# Patient Record
Sex: Female | Born: 1960 | Race: White | Hispanic: No | Marital: Single | Smoking: Former smoker
Health system: Southern US, Community
[De-identification: ages and names within clinical notes are randomized; demographics above are authoritative.]

## PROBLEM LIST (undated history)

## (undated) DIAGNOSIS — L409 Psoriasis, unspecified: Secondary | ICD-10-CM

## (undated) DIAGNOSIS — J189 Pneumonia, unspecified organism: Principal | ICD-10-CM

## (undated) DIAGNOSIS — N762 Acute vulvitis: Secondary | ICD-10-CM

## (undated) DIAGNOSIS — A64 Unspecified sexually transmitted disease: Secondary | ICD-10-CM

## (undated) DIAGNOSIS — F1910 Other psychoactive substance abuse, uncomplicated: Secondary | ICD-10-CM

## (undated) HISTORY — DX: Pneumonia, unspecified organism: J18.9

## (undated) HISTORY — DX: Acute vulvitis: N76.2

## (undated) HISTORY — PX: COLPOSCOPY: SHX161

## (undated) HISTORY — DX: Other psychoactive substance abuse, uncomplicated: F19.10

## (undated) HISTORY — DX: Unspecified sexually transmitted disease: A64

## (undated) HISTORY — DX: Psoriasis, unspecified: L40.9

## (undated) HISTORY — PX: CRYOABLATION: SHX1415

---

## 1998-09-01 ENCOUNTER — Other Ambulatory Visit: Admission: RE | Admit: 1998-09-01 | Discharge: 1998-09-01 | Payer: Self-pay | Admitting: Obstetrics and Gynecology

## 1999-05-06 ENCOUNTER — Other Ambulatory Visit: Admission: RE | Admit: 1999-05-06 | Discharge: 1999-05-06 | Payer: Self-pay | Admitting: Obstetrics and Gynecology

## 2001-01-25 ENCOUNTER — Other Ambulatory Visit: Admission: RE | Admit: 2001-01-25 | Discharge: 2001-01-25 | Payer: Self-pay | Admitting: Obstetrics and Gynecology

## 2002-04-30 ENCOUNTER — Other Ambulatory Visit: Admission: RE | Admit: 2002-04-30 | Discharge: 2002-04-30 | Payer: Self-pay | Admitting: Obstetrics and Gynecology

## 2003-05-07 ENCOUNTER — Other Ambulatory Visit: Admission: RE | Admit: 2003-05-07 | Discharge: 2003-05-07 | Payer: Self-pay | Admitting: Obstetrics and Gynecology

## 2003-09-24 HISTORY — PX: OTHER SURGICAL HISTORY: SHX169

## 2004-07-20 ENCOUNTER — Other Ambulatory Visit: Admission: RE | Admit: 2004-07-20 | Discharge: 2004-07-20 | Payer: Self-pay | Admitting: Obstetrics and Gynecology

## 2005-07-20 ENCOUNTER — Other Ambulatory Visit: Admission: RE | Admit: 2005-07-20 | Discharge: 2005-07-20 | Payer: Self-pay | Admitting: Obstetrics & Gynecology

## 2006-09-01 ENCOUNTER — Other Ambulatory Visit: Admission: RE | Admit: 2006-09-01 | Discharge: 2006-09-01 | Payer: Self-pay | Admitting: Obstetrics and Gynecology

## 2007-09-03 ENCOUNTER — Other Ambulatory Visit: Admission: RE | Admit: 2007-09-03 | Discharge: 2007-09-03 | Payer: Self-pay | Admitting: Obstetrics and Gynecology

## 2008-06-11 ENCOUNTER — Encounter: Admission: RE | Admit: 2008-06-11 | Discharge: 2008-06-11 | Payer: Self-pay | Admitting: Orthopedic Surgery

## 2008-09-03 ENCOUNTER — Other Ambulatory Visit: Admission: RE | Admit: 2008-09-03 | Discharge: 2008-09-03 | Payer: Self-pay | Admitting: Obstetrics & Gynecology

## 2012-05-26 DIAGNOSIS — J189 Pneumonia, unspecified organism: Secondary | ICD-10-CM

## 2012-05-26 HISTORY — DX: Pneumonia, unspecified organism: J18.9

## 2012-06-11 ENCOUNTER — Encounter: Payer: Self-pay | Admitting: Medical

## 2012-06-11 ENCOUNTER — Ambulatory Visit (INDEPENDENT_AMBULATORY_CARE_PROVIDER_SITE_OTHER): Payer: Self-pay | Admitting: Medical

## 2012-06-11 ENCOUNTER — Ambulatory Visit
Admission: RE | Admit: 2012-06-11 | Discharge: 2012-06-11 | Disposition: A | Discharge status: Home / Self Care | Payer: No Typology Code available for payment source | Source: Ambulatory Visit | Attending: Medical | Admitting: Medical

## 2012-06-11 VITALS — BP 128/80 | HR 106 | Temp 98.8°F | Resp 18 | Wt 139.0 lb

## 2012-06-11 DIAGNOSIS — R21 Rash and other nonspecific skin eruption: Secondary | ICD-10-CM

## 2012-06-11 DIAGNOSIS — R05 Cough: Secondary | ICD-10-CM

## 2012-06-11 DIAGNOSIS — R062 Wheezing: Secondary | ICD-10-CM

## 2012-06-11 DIAGNOSIS — R059 Cough, unspecified: Secondary | ICD-10-CM

## 2012-06-11 DIAGNOSIS — R509 Fever, unspecified: Secondary | ICD-10-CM

## 2012-06-11 DIAGNOSIS — H9210 Otorrhea, unspecified ear: Secondary | ICD-10-CM

## 2012-06-11 MED ORDER — ALBUTEROL SULFATE HFA 108 (90 BASE) MCG/ACT IN AERS: 2.0000 | INHALATION_SPRAY | Freq: Four times a day (QID) | RESPIRATORY_TRACT | Status: DC | PRN

## 2012-06-11 MED ORDER — LEVOFLOXACIN 750 MG PO TABS: 750.0000 mg | ORAL_TABLET | Freq: Every day | ORAL | Status: DC

## 2012-06-11 NOTE — Progress Notes (Signed)
Subjective: Here as new patient today.  Normally sees [facility], but having trouble getting into their office.    She denies hx/o asthma, COPD, lung disease, but she reports 2 wk ago started feeling bad with cough, fever up to 102, chest congestion, but cough productive the last 5-6 days, getting worse.  Her boyfriend has been sick with similar.  She has been using OTC cold and flu medication.  Now she has hoarseness, some SOB.  She notes hx/o pneumonia 15 years ago.   Nonsmoker.  No other aggravating or relieving factors.   She notes hx/o chronic ear infections.  Uses neosporin in ear canal daily for unknown long period of time without much benefit.  History reviewed. No pertinent past medical history. Social: self employed frame shop  Review of Systems Constitutional: +fever, -chills, -sweats, -unexpected weight change,+fatigue ENT: -runny nose, +sore throat Cardiology:  -chest pain, -palpitations, -edema Respiratory: +cough, +shortness of breath, +wheezing Gastroenterology: -abdominal pain, -nausea, -vomiting, -diarrhea, -constipation   Objective: Filed Vitals:   06/11/12 1522  BP: 128/80  Pulse: 106  Temp: 98.8 F (37.1 C)  Resp: 18    General appearance: alert, no distress, WD/WN, hoarse voice HEENT: normocephalic, sclerae anicteric, ear canals bilat with erythema, swelling, small erythematous papules along right external ear meatus, left canal with erythema, no obvious drainage, but inflamed appearing canals bilat, TMs with serous fluid behind TMs, nares patent, no discharge or erythema, pharynx normal Oral cavity: MMM, no lesions Neck: supple, no lymphadenopathy, no thyromegaly, no masses Heart: RRR, normal S1, S2, no murmurs Lungs: diffuse wheezes, decreased lower field sounds, no rhonchi or rales MSK: no calve tenderness, no palpable cord, -Homans Ext: no edema, no cyanosis or clubbing Pulses: 2+ symmetric, upper and lower extremities, normal cap  refill  Assessment: Encounter Diagnoses  Name Primary?  . Cough Yes  . Fever, unspecified   . Wheezing   . Rash and nonspecific skin eruption   . Ear discharge     Plan: Cough, fever, wheezing - likely bronchitis vs pneumonia.  Will send for CXR.    Rash, ear discharge - differential includes chronic otitis externa vs neomycin allergy.  Advised she stop neosporin.  Recheck 1wk.  Follow-up pending xray

## 2012-06-12 ENCOUNTER — Other Ambulatory Visit: Payer: Self-pay | Admitting: Medical

## 2012-06-12 ENCOUNTER — Telehealth: Payer: Self-pay | Admitting: Medical

## 2012-06-12 MED ORDER — DOXYCYCLINE HYCLATE 100 MG PO TABS: 100.0000 mg | ORAL_TABLET | Freq: Two times a day (BID) | ORAL | Status: DC

## 2012-06-12 NOTE — Telephone Encounter (Signed)
pls call and let her know that different antibioitc sent.  Have her call and make sure this one is reasonable price (doxycycline). If not, let me know ASAP.    She has pneumonia and we need to get her on antibiotic right away.

## 2012-06-12 NOTE — Telephone Encounter (Signed)
CALLED PT TO ADVISE THAT ANOTHER ANTIBIOTIC SENT TO PHARMACY. SHE WILL CALL us IF THAT ONE IS TOO EXPENSIVE. PT STATES SHE TOOK ONE LEVAQUIN AND HAS BEEN USING INHALER THAT WAS GIVEN YESTERDAY. HER STOMACH IS UPSET. WANTS TO KNOW IF ONE OF THESE MEDS WOULD UPSET HER STOMACH?

## 2012-06-12 NOTE — Telephone Encounter (Signed)
Pt does still have an upset stomach and the doxycycline is only $15.00 so much more afforable she said

## 2012-06-18 ENCOUNTER — Encounter: Payer: Self-pay | Admitting: Medical

## 2012-06-18 ENCOUNTER — Ambulatory Visit (INDEPENDENT_AMBULATORY_CARE_PROVIDER_SITE_OTHER): Payer: Self-pay | Admitting: Medical

## 2012-06-18 VITALS — BP 122/70 | HR 87 | Temp 98.3°F | Resp 16

## 2012-06-18 DIAGNOSIS — J189 Pneumonia, unspecified organism: Secondary | ICD-10-CM

## 2012-06-18 NOTE — Progress Notes (Signed)
Subjective: Here for f/u on pneumonia. I saw her as a new patient last week and she was +for left lower lobe pneumonia on xray.   She is taking the Levaquin.  Feels much improved.  She denies hx/o asthma, COPD, lung disease, but started 3 wk ago with bad with cough, fever up to 102, chest congestion.  Nonsmoker.  No other aggravating or relieving factors.   Past Medical History  Diagnosis Date  . Pneumonia 2/14   Social: self employed frame shop  Review of Systems Constitutional: +fever, -chills, -sweats, -unexpected weight change, -fatigue ENT: -runny nose, -sore throat Cardiology:  -chest pain, -palpitations, -edema Respiratory: +cough, -shortness of breath, -wheezing Gastroenterology: -abdominal pain, -nausea, -vomiting, -diarrhea, -constipation   Objective: Filed Vitals:   06/18/12 1453  BP: 122/70  Pulse: 87  Temp: 98.3 F (36.8 C)  Resp: 16    General appearance: alert, no distress, WD/WN Neck: supple, no lymphadenopathy, no thyromegaly, no masses Lungs: CTA, no wheezes, no rhonchi or rales Ext: no edema, no cyanosis or clubbing   Assessment: Encounter Diagnosis  Name Primary?  . Pneumonia Yes    Plan: Improving, finish Levaquin, advised that cough may linger.  Call or return if worsening.  otherwise symptoms should gradually resolve.

## 2012-10-08 ENCOUNTER — Ambulatory Visit: Payer: Self-pay | Admitting: Nurse Practitioner

## 2012-11-01 ENCOUNTER — Encounter: Payer: Self-pay | Admitting: *Deleted

## 2012-11-06 ENCOUNTER — Ambulatory Visit (INDEPENDENT_AMBULATORY_CARE_PROVIDER_SITE_OTHER): Payer: Self-pay | Admitting: Nurse Practitioner

## 2012-11-06 ENCOUNTER — Encounter: Payer: Self-pay | Admitting: Nurse Practitioner

## 2012-11-06 VITALS — BP 104/58 | HR 64 | Resp 14 | Ht 62.5 in | Wt 143.4 lb

## 2012-11-06 DIAGNOSIS — Z01419 Encounter for gynecological examination (general) (routine) without abnormal findings: Secondary | ICD-10-CM

## 2012-11-06 DIAGNOSIS — Z0000 Encounter for general adult medical examination without abnormal findings: Secondary | ICD-10-CM

## 2012-11-06 LAB — POCT URINALYSIS DIPSTICK: pH, UA: 5.5

## 2012-11-06 NOTE — Patient Instructions (Signed)

## 2012-11-06 NOTE — Progress Notes (Signed)
52 y.o. G0P0 Single Caucasian Fe here for annual exam.  Off POP since 03/2012.  No withdrawal bled after completion. And no vaginal bleeding since 2002. Still having vaginal dryness.  No vaso symptoms. Sleep is good.   Patient's last menstrual period was 12/24/2000.          Sexually active: yes  The current method of family planning is pt is post-menopausal.     Exercising: yes  walking Smoker:  no  Health Maintenance: Pap:  10/05/2011  Normal with negative HR HPV MMG:  06/09/2009 Colonoscopy:  never BMD:   never TDaP:  04/2003 Labs: Hgb- 13.4   reports that she quit smoking about 9 years ago. Her smoking use included Cigarettes. She has a 2.5 pack-year smoking history. She has never used smokeless tobacco. She reports that she drinks about 4.2 ounces of alcohol per week. She reports that she does not use illicit drugs.  Past Medical History  Diagnosis Date  . Pneumonia 2/14  . Vulvitis   . Substance abuse     smokes weed  . STD (sexually transmitted disease)     HSV  . Psoriasis     Past Surgical History  Procedure Laterality Date  . Cryoablation      CIN I   . Bartholian cyst  09/2003  . Colposcopy      Current Outpatient Prescriptions  Medication Sig Dispense Refill  . calcium carbonate (OS-CAL) 600 MG TABS Take 600 mg by mouth 2 (two) times daily with a meal.      . Multiple Vitamin (MULTIVITAMIN) tablet Take 1 tablet by mouth daily.      . naproxen sodium (ANAPROX) 220 MG tablet Take 220 mg by mouth 2 (two) times daily with a meal.       No current facility-administered medications for this visit.    Family History  Problem Relation Age of Onset  . Lupus Sister 80  . Breast cancer Mother 66  . Stroke Father     ROS:  Pertinent items are noted in HPI.  Otherwise, a comprehensive ROS was negative.  Exam:   BP 104/58  Pulse 64  Resp 14  Ht 5' 2.5" (1.588 m)  Wt 143 lb 6.4 oz (65.046 kg)  BMI 25.79 kg/m2  LMP 12/24/2000 Height: 5' 2.5" (158.8 cm)  Ht  Readings from Last 3 Encounters:  11/06/12 5' 2.5" (1.588 m)    General appearance: alert, cooperative and appears stated age Head: Normocephalic, without obvious abnormality, atraumatic Neck: no adenopathy, supple, symmetrical, trachea midline and thyroid normal to inspection and palpation Lungs: clear to auscultation bilaterally Breasts: normal appearance, no masses or tenderness Heart: regular rate and rhythm Abdomen: soft, non-tender; no masses,  no organomegaly Extremities: extremities normal, atraumatic, no cyanosis or edema Skin: Skin color, texture, turgor normal. No rashes or lesions Lymph nodes: Cervical, supraclavicular, and axillary nodes normal. No abnormal inguinal nodes palpated Neurologic: Grossly normal   Pelvic: External genitalia:  no lesions              Urethra:  normal appearing urethra with no masses, tenderness or lesions              Bartholin's and Skene's: normal                 Vagina: normal appearing vagina with normal color and discharge, no lesions              Cervix: anteverted  Pap taken: no Bimanual Exam:  Uterus:  normal size, contour, position, consistency, mobility, non-tender              Adnexa: no mass, fullness, tenderness               Rectovaginal: Confirms               Anus:  normal sphincter tone, no lesions  A:  Well Woman with normal exam  Postmenopausal  Off POP since 03/2012  history of amenorrhea since 2002  P:   Pap smear as per guidelines   Information about mammogram scholarship program since she is self pay  Mammogram is due   counseled on breast self exam, mammography screening, adequate   intake of calcium and vitamin D, diet and exercise return annually or prn  An After Visit Summary was printed and given to the patient.

## 2012-11-08 NOTE — Progress Notes (Signed)
Encounter reviewed by Dr. Mayre Bury Silva.  

## 2013-02-28 ENCOUNTER — Other Ambulatory Visit: Payer: Self-pay

## 2013-04-22 ENCOUNTER — Encounter: Payer: Self-pay | Admitting: Nurse Practitioner

## 2013-08-23 ENCOUNTER — Telehealth: Payer: Self-pay | Admitting: Medical

## 2013-08-23 NOTE — Telephone Encounter (Signed)
How long was the tick attached? If > 2 days attached let me know  If no fever, chills, muscle aches, joint aches, expanding rash, probably okay.  However, if new symptoms in the coming weeks to month or so with fever, joint aches, rash, significant fatigue, then recheck.  I would take some Benadryl for few days.

## 2013-08-23 NOTE — Telephone Encounter (Signed)
Patient states that she got a bit on Tuesday and had a red spot and it was there on Wednesday but by Thursday it was gone. She states that she feels fine. She wanted to know if she will get any SX. Later on. CLS

## 2013-08-23 NOTE — Telephone Encounter (Signed)
Patient would like someone to call her, she has questions about a tick bite  Bite was Tuesday, was red Tuesday and Wednesday but is now clearing up, has questions about long term possible issues

## 2013-08-23 NOTE — Telephone Encounter (Signed)
pls call and inquire 

## 2013-08-26 NOTE — Telephone Encounter (Signed)
Patient states that the tick was not on long. Probably about few minutes. CLS  Patient is aware of Maria Mann message and she will take the Benadryl for the next few days. CLS

## 2013-11-07 ENCOUNTER — Ambulatory Visit: Payer: Self-pay | Admitting: Nurse Practitioner

## 2013-11-18 ENCOUNTER — Ambulatory Visit: Payer: Self-pay | Admitting: Nurse Practitioner

## 2014-01-02 ENCOUNTER — Encounter: Payer: Self-pay | Admitting: Nurse Practitioner

## 2014-01-02 ENCOUNTER — Ambulatory Visit (INDEPENDENT_AMBULATORY_CARE_PROVIDER_SITE_OTHER): Payer: Self-pay | Admitting: Nurse Practitioner

## 2014-01-02 VITALS — BP 130/84 | HR 92 | Ht 62.75 in | Wt 147.0 lb

## 2014-01-02 DIAGNOSIS — Z1211 Encounter for screening for malignant neoplasm of colon: Secondary | ICD-10-CM

## 2014-01-02 DIAGNOSIS — Z23 Encounter for immunization: Secondary | ICD-10-CM

## 2014-01-02 DIAGNOSIS — Z0000 Encounter for general adult medical examination without abnormal findings: Secondary | ICD-10-CM

## 2014-01-02 DIAGNOSIS — Z01419 Encounter for gynecological examination (general) (routine) without abnormal findings: Secondary | ICD-10-CM

## 2014-01-02 LAB — LIPID PANEL
Cholesterol: 186 mg/dL (ref 0–200)
HDL: 55 mg/dL (ref >39)
LDL CALC: 104 mg/dL — AB (ref 0–99)
TRIGLYCERIDES: 135 mg/dL (ref <150)
Total CHOL/HDL Ratio: 3.4 Ratio
VLDL: 27 mg/dL (ref 0–40)

## 2014-01-02 LAB — POCT URINALYSIS DIPSTICK
Bilirubin, UA: NEGATIVE
GLUCOSE UA: NEGATIVE
Ketones, UA: NEGATIVE
Leukocytes, UA: NEGATIVE
Nitrite, UA: NEGATIVE
PH UA: 5
Protein, UA: NEGATIVE
RBC UA: NEGATIVE
UROBILINOGEN UA: NEGATIVE

## 2014-01-02 LAB — HEMOGLOBIN, FINGERSTICK: Hemoglobin, fingerstick: 15 g/dL (ref 12.0–16.0)

## 2014-01-02 NOTE — Progress Notes (Signed)
Patient ID: Maria Mann, female   DOB: 07-Oct-1960, 53 y.o.   MRN: [id number] 53 y.o. G0P0 Single Caucasian Fe here for annual exam.  Not SA.  Some vaso symptoms that are tolerable.  She continues to work as a Immunologist.  Patient's last menstrual period was 12/24/2000.          Sexually active: yes  The current method of family planning is pt is post-menopausal.  Exercising: yes walking  Smoker: no   Health Maintenance:  Pap: 10/05/2011 Normal with negative HR HPV  MMG: 06/09/2009 Colonoscopy: never-  IFOB given BMD: never  TDaP: 04/2003  Labs:  HB:  15.0  Urine:  Negative    reports that she quit smoking about 10 years ago. Her smoking use included Cigarettes. She has a 2.5 pack-year smoking history. She has never used smokeless tobacco. She reports that she drinks about 4.2 ounces of alcohol per week. She reports that she does not use illicit drugs.  Past Medical History  Diagnosis Date  . Pneumonia 2/14  . Vulvitis   . Substance abuse     smokes weed  . STD (sexually transmitted disease)     HSV  . Psoriasis     Past Surgical History  Procedure Laterality Date  . Cryoablation      CIN I   . Bartholian cyst  09/2003  . Colposcopy      Current Outpatient Prescriptions  Medication Sig Dispense Refill  . calcium carbonate (OS-CAL) 600 MG TABS Take 600 mg by mouth 2 (two) times daily with a meal.      . Multiple Vitamin (MULTIVITAMIN) tablet Take 1 tablet by mouth daily.      . naproxen sodium (ANAPROX) 220 MG tablet Take 220 mg by mouth 2 (two) times daily with a meal.       No current facility-administered medications for this visit.    Family History  Problem Relation Age of Onset  . Lupus Sister 78  . Breast cancer Mother 45  . Stroke Father     ROS:  Pertinent items are noted in HPI.  Otherwise, a comprehensive ROS was negative.  Exam:   BP 130/84  Pulse 92  Ht 5' 2.75" (1.594 m)  Wt 147 lb (66.679 kg)  BMI 26.24 kg/m2  LMP 12/24/2000 Height: 5'  2.75" (159.4 cm)  Ht Readings from Last 3 Encounters:  01/02/14 5' 2.75" (1.594 m)  11/06/12 5' 2.5" (1.588 m)    General appearance: alert, cooperative and appears stated age Head: Normocephalic, without obvious abnormality, atraumatic Neck: no adenopathy, supple, symmetrical, trachea midline and thyroid normal to inspection and palpation Lungs: clear to auscultation bilaterally Breasts: normal appearance, no masses or tenderness Heart: regular rate and rhythm Abdomen: soft, non-tender; no masses,  no organomegaly Extremities: extremities normal, atraumatic, no cyanosis or edema Skin: Skin color, texture, turgor normal. No rashes or lesions Lymph nodes: Cervical, supraclavicular, and axillary nodes normal. No abnormal inguinal nodes palpated Neurologic: Grossly normal   Pelvic: External genitalia:  no lesions              Urethra:  normal appearing urethra with no masses, tenderness or lesions              Bartholin's and Skene's: normal                 Vagina: normal appearing vagina with normal color and discharge, no lesions  Cervix: anteverted              Pap taken: No. Bimanual Exam:  Uterus:  normal size, contour, position, consistency, mobility, non-tender              Adnexa: no mass, fullness, tenderness               Rectovaginal: Confirms               Anus:  normal sphincter tone, no lesions  A:  Well Woman with normal exam  Postmenopausal   Off POP since 03/2012   History of amenorrhea since 2002  Immunization update   P:   Reviewed health and wellness pertinent to exam  Pap smear taken today  Mammogram is past due and will try to get done  IFOB is given  Will follow with lipid panel  Update on TDaP today  Counseled on breast self exam, mammography screening, adequate intake of calcium and vitamin D, diet and exercise return annually or prn  An After Visit Summary was printed and given to the patient. She has no insurance so is self  pay

## 2014-01-02 NOTE — Patient Instructions (Signed)

## 2014-01-05 NOTE — Progress Notes (Signed)
Encounter reviewed by Dr. Brook Silva.  

## 2014-02-07 ENCOUNTER — Other Ambulatory Visit: Payer: Self-pay

## 2014-07-02 ENCOUNTER — Ambulatory Visit (INDEPENDENT_AMBULATORY_CARE_PROVIDER_SITE_OTHER): Payer: Self-pay | Admitting: Nurse Practitioner

## 2014-07-02 ENCOUNTER — Encounter: Payer: Self-pay | Admitting: Nurse Practitioner

## 2014-07-02 VITALS — BP 110/72 | HR 70 | Resp 16 | Ht 62.75 in | Wt 147.0 lb

## 2014-07-02 DIAGNOSIS — R21 Rash and other nonspecific skin eruption: Secondary | ICD-10-CM

## 2014-07-02 NOTE — Patient Instructions (Signed)
We will make you a dermatology apt. ASAP.

## 2014-07-02 NOTE — Progress Notes (Signed)
54 y.o.SW Fe here with complaint of vaginal symptoms of itching in the vulvar and inguinal area for about a week.  She did not apply any medication but felt like air and cool water would help.  She then developed a rash on the abdomen and between the breast.  Maybe slight more itching.  Then rash spread to arms, back and legs.  She has a history of psoriasis but has never had a flare like this. Denies new personal products or vaginal dryness.  Denies urinary symptoms.  No STD concerns.  Contraception is postmenopausal.  She knows of no contacts with any illness.  She does not feel sick and no recent fever or chills.  No change in med's.  No other associated joint pain.   O: Healthy female WDWN Affect: normal, orientation x 3  Exam: very uncomfortable with rash. Abdomen:  Soft - multiple area of rash on trunk the most red area between the breast and groin areas Lymph node: no enlargement or tenderness Pelvic exam: External genital: normal female with a very red rash that is surrounding the labia and radiates to the perianal area. BUS: negative Vagina: no  discharge noted.  Cervix: normal, non tender, no CMT  Patient is seen by Dr. Conley SimmondsBrook Silva as well   A:  Diffuse papular rash over entire body not to include the neck and face=  ? etiology  History psoriasis  History of postmenopausal - no HRT  History of HSV without a flare  Rash not consistent with Pityriasis   P: Discussed findings of rash and etiology. Discussed Aveeno or baking soda sitz bath for comfort. Avoid moist clothes or pads for extended period of time.  RX: None given - did not want to start on Prednisone until a diagnosis was made.  Needs consult with Dermatologist ASAP due to the extent of the rash. - we will try to facilitate this process. RV prn

## 2014-07-03 ENCOUNTER — Telehealth: Payer: Self-pay | Admitting: Emergency Medicine

## 2014-07-03 NOTE — Telephone Encounter (Signed)
[facility] She states that patient has an appointment for 0940 with Dr. SwazilandJordan tomorrow, 07/04/14. She states patient is aware.  Called patient and confirmed patient is aware of appointment, she is. She will call us with any further needs. Routing to provider for final review. Patient agreeable to disposition. Will close encounter

## 2014-07-06 NOTE — Progress Notes (Signed)
Encounter reviewed by Dr. Mayia Megill Silva.  

## 2014-07-09 ENCOUNTER — Telehealth: Payer: Self-pay | Admitting: Nurse Practitioner

## 2014-07-09 NOTE — Telephone Encounter (Signed)
Patient was last seen 07/02/14 and "has another problem and wondering if they are related".

## 2014-07-09 NOTE — Telephone Encounter (Signed)
Spoke with patient. Patient was seen 07/02/2014 for body rash. Was referred to dermatology and seen on 3/11. Patient states she was given a cream that has relieved the rash but "Now I have a bump or boil down there. It is red and swollen and full of pus. It is really sore. I do not know if it could be related or not?"  Patient first noticed the area yesterday. Denies any fevers or chills. Advised patient will need to be seen for evaluation with MD in the office. Patient is agreeable. Appointment scheduled for tomorrow at 10am with Dr.Miller (date and time per Kennon RoundsSally). Patient is agreeable.  Routing to provider for final review. Patient agreeable to disposition. Will close encounter

## 2014-07-10 ENCOUNTER — Encounter: Payer: Self-pay | Admitting: Obstetrics & Gynecology

## 2014-07-10 ENCOUNTER — Ambulatory Visit (INDEPENDENT_AMBULATORY_CARE_PROVIDER_SITE_OTHER): Payer: Self-pay | Admitting: Obstetrics & Gynecology

## 2014-07-10 VITALS — BP 148/82 | HR 80 | Temp 98.5°F | Resp 24 | Wt 147.6 lb

## 2014-07-10 DIAGNOSIS — N764 Abscess of vulva: Secondary | ICD-10-CM

## 2014-07-10 MED ORDER — SULFAMETHOXAZOLE-TRIMETHOPRIM 800-160 MG PO TABS: 1.0000 | ORAL_TABLET | Freq: Two times a day (BID) | ORAL | Status: DC

## 2014-07-10 NOTE — Progress Notes (Signed)
Subjective:     Patient ID: Maria Mann, female   DOB: 01/24/1961, 54 y.o.   MRN: [id number]  HPI 54 yo G0 SWF here for three to four day hx of left vulvar swelling, tenderness, and now drainage.  Reports she had a little pimple like lesion present at the beginning of the week.  It has just gradually grown and changed and started draining last night.  It is tender and labia is swollen.  She hasn't really used anything on the area.  She has taken some Aleve which has helped a little.  Declines need for anything for pain.  She did have a 101.0 temp yesterday but hasn't had one since.  No hx of diabetes.  Has used a little motrin.  Was seen last week for a vulvar and torso rash that has resolved.  Pt not sure if this is related.  Feels like it is not.  H/O psoriasis.    Review of Systems  All other systems reviewed and are negative.      Objective:   Physical Exam  Constitutional: She is oriented to person, place, and time. She appears well-developed and well-nourished.  Genitourinary:    There is no rash, tenderness, lesion or injury on the right labia. There is tenderness and lesion on the left labia.  Lymphadenopathy:       Right: No inguinal adenopathy present.       Left: No inguinal adenopathy present.  Neurological: She is alert and oriented to person, place, and time.  Skin: Skin is warm and dry.  Psychiatric: She has a normal mood and affect.       Assessment:     Left labial abscess     Plan:     Hot compresses/tub bathes 2-3 times daily Anti-inflammatories recommended Bactrim DS bid x 7 days Recheck Monday Wound culture pending

## 2014-07-11 ENCOUNTER — Telehealth: Payer: Self-pay

## 2014-07-11 NOTE — Telephone Encounter (Signed)
Spoke with patient. Patient states she is feeling "much better. I have had a lot of relief. It seems to be going down and it is still draining some but that is good I suppose."  Advised drainage is normal. Patient denies any additional swelling or redness to the area. Patient is scheduled for follow up on 3/21 at 4pm with Dr.Miller. Patient is agreeable to appointment date and time. Advised will provide Dr.Miller with an update and we are glad she is feeling better. Patient to call with any questions or concerns.  Routing to provider for final review. Patient agreeable to disposition. Will close encounter

## 2014-07-11 NOTE — Telephone Encounter (Signed)
-----   Message from Jerene BearsMary S Miller, MD sent at 07/10/2014 11:04 AM EDT ----- Regarding: check on pt Maria Mann, Can you call and check on this pt tomorrow?  She was seen today and has a large draining left labial abscess.  I started her on abx but want to make sure she is better before the weekend.  Thanks.  MSM

## 2014-07-14 ENCOUNTER — Telehealth: Payer: Self-pay | Admitting: Emergency Medicine

## 2014-07-14 ENCOUNTER — Ambulatory Visit: Payer: Self-pay | Admitting: Obstetrics & Gynecology

## 2014-07-14 LAB — WOUND CULTURE: Gram Stain: NONE SEEN

## 2014-07-14 NOTE — Telephone Encounter (Signed)
Called patient to reschedule due to provider cancellation. She is agreeable to appointment tomorrow at 1530 and is r/s for this time.   Routing to provider for final review. Patient agreeable to disposition. Will close encounter

## 2014-07-15 ENCOUNTER — Ambulatory Visit (INDEPENDENT_AMBULATORY_CARE_PROVIDER_SITE_OTHER): Payer: Self-pay | Admitting: Obstetrics & Gynecology

## 2014-07-15 VITALS — BP 140/70 | HR 72 | Temp 99.5°F | Resp 16 | Wt 147.6 lb

## 2014-07-15 DIAGNOSIS — N764 Abscess of vulva: Secondary | ICD-10-CM

## 2014-07-15 DIAGNOSIS — Z22322 Carrier or suspected carrier of Methicillin resistant Staphylococcus aureus: Secondary | ICD-10-CM | POA: Insufficient documentation

## 2014-07-15 MED ORDER — MUPIROCIN 2 % EX OINT: 1.0000 "application " | TOPICAL_OINTMENT | Freq: Two times a day (BID) | CUTANEOUS | Status: DC

## 2014-07-15 NOTE — Progress Notes (Signed)
Subjective:     Patient ID: Maria Mann, female   DOB: 07/27/1960, 54 y.o.   MRN: [id number]  HPI 54 yo G0 SWF here for recheck of vulvar abscess.  Still on antibiotic but only has one day left.  Drainage stopped.  Pain gone.    Pt reports slight rash and low grade temp today.  Just feels achy all over.    Reviewed MRSA finding with pt and importance of cleaning bathroom with bleach products.  Also care of clothes with active abscess discussed.  Recurrence issues, colonization, and ways to decreased colonization all discussed.  All questions answered.  Review of Systems  Constitutional: Positive for fever (low grade).  All other systems reviewed and are negative.      Objective:   Physical Exam  Constitutional: She is oriented to person, place, and time. She appears well-developed and well-nourished.  Genitourinary:    There is no rash, tenderness, lesion or injury on the right labia.  Neurological: She is alert and oriented to person, place, and time.  Psychiatric: She has a normal mood and affect.       Assessment:     MRSA + vulvar abscess, much improved  Possible developing viral infection.  Pt to monitor.    Plan:     Complete antibiotics--has one day left Discussed with pt importance of knowledge about MRSA and speed with which abscess can occur.   Mupirocin ointment to nares bid for 7 days to decrease colonization

## 2014-07-17 ENCOUNTER — Telehealth: Payer: Self-pay | Admitting: Obstetrics & Gynecology

## 2014-07-17 ENCOUNTER — Encounter: Payer: Self-pay | Admitting: Medical

## 2014-07-17 ENCOUNTER — Ambulatory Visit (INDEPENDENT_AMBULATORY_CARE_PROVIDER_SITE_OTHER): Payer: Self-pay | Admitting: Medical

## 2014-07-17 VITALS — BP 128/82 | HR 102 | Temp 98.0°F | Resp 17 | Wt 147.0 lb

## 2014-07-17 DIAGNOSIS — L270 Generalized skin eruption due to drugs and medicaments taken internally: Secondary | ICD-10-CM

## 2014-07-17 DIAGNOSIS — Z889 Allergy status to unspecified drugs, medicaments and biological substances status: Secondary | ICD-10-CM

## 2014-07-17 DIAGNOSIS — T7840XA Allergy, unspecified, initial encounter: Secondary | ICD-10-CM

## 2014-07-17 DIAGNOSIS — T50995A Adverse effect of other drugs, medicaments and biological substances, initial encounter: Secondary | ICD-10-CM

## 2014-07-17 MED ORDER — METHYLPREDNISOLONE (PAK) 4 MG PO TABS: ORAL_TABLET | ORAL | Status: DC

## 2014-07-17 NOTE — Progress Notes (Signed)
Subjective: Here for breaking out all over the last 2 days.   This rash started on her thighs. Started with spots of redness, then over the last 2 days spread all over, including arms, torso, legs, neck, face.  Rash is itchy but not real horrible.  Denies any new exposures. She does note having psoriasis flare in the genital region 3 weeks ago was seen by dermatology, and given cream and that went away. Then last week she had a cyst in the vaginal area that was treated with antibiotics by gynecology, Bactrim, started this about a week ago.  Then the early part of this week had a fever around 100 several days, body aches, chills. She thought she may have the flu. Today actually feels okay except for the rash. Taking nothing else for the rash. No prior similar.  No other aggravating or relieving factors. No other complaint.  Review of systems as in subjective  Objective: BP 128/82 mmHg  Pulse 102  Temp(Src) 98 F (36.7 C) (Oral)  Resp 17  Wt 147 lb (66.679 kg)  LMP 12/24/2000  General appearance: alert, no distress, WD/WN Skin: diffuse macular rash, pink/red coloration including face, neck, arms, torso, legs, but seems to spare palms and soles, cheeks are flushed HEENT: normocephalic, sclerae anicteric, TMs pearly, nares patent, no discharge or erythema, pharynx normal Oral cavity: MMM, no lesions Neck: supple, no lymphadenopathy, no thyromegaly, no masses Heart: RRR, normal S1, S2, no murmurs Lungs: CTA bilaterally, no wheezes, rhonchi, or rales Pulses: 2+ symmetric, upper and lower extremities, normal cap refill   Assessment: Encounter Diagnoses  Name Primary?  . Allergic reaction to drug Yes  . Allergic drug rash     Plan: Discuss case with Dr. Lynelle DoctorKnapp supervising physician who also examined the patient.  Rash suggestive of drug reaction to Bactrim /sulfa.  Advise she stop Bactrim immediately.  Begin Benadryl every 4-6 hours over-the-counter, begin Medrol Dosepak, good hydration, and  if rash not improving the next several days let us know.  Discussed other potential complications or worsening symptoms related to Bactrim drug or rash that would prompt visit to the emergency department.   Follow-up when necessary

## 2014-07-17 NOTE — Telephone Encounter (Signed)
Spoke with patient. Advised that I have spoke with Dr.Miller who would like her to be seen with her PCP regarding symptoms. Patient is agreeable and will call to schedule an appointment.  Routing to provider for final review. Patient agreeable to disposition. Will close encounter

## 2014-07-17 NOTE — Telephone Encounter (Signed)
Patient is asking to send a photo to Dr.Miller of her condition. Patient was sen recently and is not feeling well. Last seen 07/15/14.

## 2014-07-17 NOTE — Telephone Encounter (Addendum)
Spoke with patient. Patient states that she has not been feeling well since she was seen on 3/22 with Dr.Miller. Was seen for follow up of left labial cyst that was positive for MRSA. Is being treated with Bactrim-has two more doses left. Patient states that she has had a fever off and on since Tuesday. Temperature is 99 currently. "I woke up and I am red all over my body. It is like I have a rash all over. It is really bad. Can you get me back in today?" Patient denies any vaginal symptoms.Denies any nausea, vomiting, or diarrhea. Advised may need to seek care at PCP regarding these symptoms. Advised patient will need to speak with Dr.Miller and return call with further recommendations for follow up. Patient is agreeable.

## 2014-07-21 ENCOUNTER — Telehealth: Payer: Self-pay | Admitting: Obstetrics & Gynecology

## 2014-07-21 NOTE — Telephone Encounter (Signed)
Pt wanted to give an update on visit to her pcp for a rash that she was told to go see them about.

## 2014-07-21 NOTE — Telephone Encounter (Signed)
Spoke with patient. Patient states that she was seen by her PCP on Thursday 3/24 for evaluation of "bright red rash all over my body." Please see telephone call to office from 3/24. Patient was advised by PCP to stop taking Bactrim as "He thinks is was an allergic reaction to the medicine." Patient is currently on prednisone and states that the rash is almost gone. Denies any fevers since starting. Denies any problems with cyst. "That is gone." Advised Bactrim has been added to list of allergies by her PCP and I will let Dr.Miller know. Patient is agreeable.   Routing to provider for final review. Patient agreeable to disposition. Will close encounter

## 2014-07-24 ENCOUNTER — Encounter: Payer: Self-pay | Admitting: Obstetrics & Gynecology

## 2014-08-07 ENCOUNTER — Ambulatory Visit: Payer: Self-pay | Admitting: Obstetrics & Gynecology

## 2015-01-06 ENCOUNTER — Ambulatory Visit: Payer: Self-pay | Admitting: Nurse Practitioner

## 2015-01-16 ENCOUNTER — Ambulatory Visit: Payer: Self-pay | Admitting: Nurse Practitioner

## 2015-03-02 ENCOUNTER — Encounter: Payer: Self-pay | Admitting: Medical

## 2015-03-02 ENCOUNTER — Ambulatory Visit (INDEPENDENT_AMBULATORY_CARE_PROVIDER_SITE_OTHER): Payer: Self-pay | Admitting: Medical

## 2015-03-02 ENCOUNTER — Telehealth: Payer: Self-pay | Admitting: Family Medicine

## 2015-03-02 ENCOUNTER — Ambulatory Visit: Payer: Self-pay | Admitting: Nurse Practitioner

## 2015-03-02 VITALS — BP 150/90 | HR 106 | Temp 98.9°F | Resp 24 | Wt 154.0 lb

## 2015-03-02 DIAGNOSIS — R030 Elevated blood-pressure reading, without diagnosis of hypertension: Secondary | ICD-10-CM | POA: Insufficient documentation

## 2015-03-02 DIAGNOSIS — Z789 Other specified health status: Secondary | ICD-10-CM

## 2015-03-02 DIAGNOSIS — R8299 Other abnormal findings in urine: Secondary | ICD-10-CM

## 2015-03-02 DIAGNOSIS — R519 Headache, unspecified: Secondary | ICD-10-CM

## 2015-03-02 DIAGNOSIS — R51 Headache: Secondary | ICD-10-CM

## 2015-03-02 DIAGNOSIS — R509 Fever, unspecified: Secondary | ICD-10-CM

## 2015-03-02 DIAGNOSIS — R8290 Unspecified abnormal findings in urine: Secondary | ICD-10-CM

## 2015-03-02 LAB — CBC WITH DIFFERENTIAL/PLATELET
BASOS PCT: 0 % (ref 0–1)
Basophils Absolute: 0 10*3/uL (ref 0.0–0.1)
EOS ABS: 0.1 10*3/uL (ref 0.0–0.7)
EOS PCT: 1 % (ref 0–5)
HEMATOCRIT: 41.2 % (ref 36.0–46.0)
Hemoglobin: 13.8 g/dL (ref 12.0–15.0)
Lymphocytes Relative: 10 % — ABNORMAL LOW (ref 12–46)
Lymphs Abs: 1.1 10*3/uL (ref 0.7–4.0)
MCH: 31.4 pg (ref 26.0–34.0)
MCHC: 33.5 g/dL (ref 30.0–36.0)
MCV: 93.8 fL (ref 78.0–100.0)
MONO ABS: 1.7 10*3/uL — AB (ref 0.1–1.0)
MPV: 11.6 fL (ref 8.6–12.4)
Monocytes Relative: 15 % — ABNORMAL HIGH (ref 3–12)
Neutro Abs: 8.1 10*3/uL — ABNORMAL HIGH (ref 1.7–7.7)
Neutrophils Relative %: 74 % (ref 43–77)
Platelets: 193 10*3/uL (ref 150–400)
RBC: 4.39 MIL/uL (ref 3.87–5.11)
RDW: 12.7 % (ref 11.5–15.5)
WBC: 11 10*3/uL — AB (ref 4.0–10.5)

## 2015-03-02 LAB — POCT URINALYSIS DIPSTICK
Bilirubin, UA: NEGATIVE
Glucose, UA: NEGATIVE
Ketones, UA: NEGATIVE
Nitrite, UA: NEGATIVE
PH UA: 6.5
RBC UA: 1
SPEC GRAV UA: 1.025
UROBILINOGEN UA: NEGATIVE

## 2015-03-02 MED ORDER — CIPROFLOXACIN HCL 500 MG PO TABS: 500.0000 mg | ORAL_TABLET | Freq: Two times a day (BID) | ORAL | Status: DC

## 2015-03-02 NOTE — Telephone Encounter (Signed)
Pt called and states pharm doesn't have Cipro.  So I called it in as they are having connectivity issues.

## 2015-03-02 NOTE — Progress Notes (Signed)
Subjective: Chief Complaint  Patient presents with  . Headache    fever started last thursday, avg 100-101. only taken aleve and cold medicine. headache coming and going.    Has been feeling bad since last Thursday 4 days ago.   Has had fever about 100-101 daily, headaches mostly front behind eyes.   Has had some urinary discomfort with urination, cloudy urine.  Denies sore throat, ear pain, head congestion, chest congestion.   Denies rash.   Has had some nausea with headache 2 weeks ago, but no nausea or vomiting in the last few days.  No recent diarrhea, no abdominal pain.  Has some mild low back pain.   No vaginal discharge.  Single, no new sexual partners, no concern for STD.   No recent sick contacts.   Went to [place] 2 weeks ago visiting family. She did sort through some things in the garage while visiting in [place], but no specific exposures.   May have been around rat droppings but not sure.  Denies neck stiffness or pain.   +body aches, some chills.  No other aggravating or relieving factors. No other complaint.  Past Medical History  Diagnosis Date  . Pneumonia 2/14  . Vulvitis   . Substance abuse     smokes weed  . STD (sexually transmitted disease)     HSV  . Psoriasis    ROS as in subjective   Objective: BP 150/90 mmHg  Pulse 106  Temp(Src) 98.9 F (37.2 C) (Oral)  Resp 24  Wt 154 lb (69.854 kg)  SpO2 97%  LMP 12/24/2000  Gen: wd, wn, nad, somewhat ill appearing Skin: warm, dry, no rash HTN unremarkable Oral MMM, no lesions Heart: tachycardic but normal s1, s2, no murmurs Lungs clear Abdomen: +bs, soft, mild suprapubic and lower generalized abdominal tenderness, no mass, no organomegaly Back: nontender No meningeal signs No edema Pulses normal   Assessment: Encounter Diagnoses  Name Primary?  . Acute intractable headache, unspecified headache type Yes  . Fever and chills   . History of recent travel   . Cloudy urine   . Elevated blood-pressure reading  without diagnosis of hypertension      Plan: Discussed symptoms, concerns.   UTI possible.  Begin Cipro, STAT CBC sent, urine culture sent.   Hydrate well, can use Tylenol or Ibuprofen OTC for fever/chills.  We will call with lab results.

## 2015-03-05 LAB — URINE CULTURE

## 2015-04-29 ENCOUNTER — Ambulatory Visit: Payer: Self-pay | Admitting: Nurse Practitioner

## 2015-11-02 ENCOUNTER — Ambulatory Visit (INDEPENDENT_AMBULATORY_CARE_PROVIDER_SITE_OTHER): Payer: Self-pay | Admitting: Medical

## 2015-11-02 ENCOUNTER — Encounter: Payer: Self-pay | Admitting: Medical

## 2015-11-02 VITALS — BP 148/90 | HR 90 | Wt 156.0 lb

## 2015-11-02 DIAGNOSIS — R51 Headache: Secondary | ICD-10-CM

## 2015-11-02 DIAGNOSIS — G8929 Other chronic pain: Secondary | ICD-10-CM

## 2015-11-02 DIAGNOSIS — I10 Essential (primary) hypertension: Secondary | ICD-10-CM

## 2015-11-02 MED ORDER — ATENOLOL 25 MG PO TABS: 25.0000 mg | ORAL_TABLET | Freq: Every day | ORAL | Status: DC

## 2015-11-02 NOTE — Progress Notes (Signed)
Subjective: Chief Complaint  Patient presents with  . bp elevated    her home cuff states it has been 145 and 150 for her top numbers. headaches. said she can "feel it"    Here for issues about BP.  "it feels high."  Feels blood pounding through her body, still getting occasional headaches. I saw her 02/2015 for headache and at that time BP was elevated.   No prior diagnosis of hypertension.   No current chest pain, SOB, edema, no paresthesias, no blurry vision.  Nonsmoker.   Drinks glass wine at night, 4-5 oz at a time probably.   drinks 1-2 cokes daily, but doesn't add salt to food.  Doesn't eat out a lot.  Is a vegetarian.    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    10 years ago was on medication for hypertension, but after quitting birth control and cutting out meat, BP issues resolved.   LMP 10-12 years ago.  Use to smoke years ago off and on, not heavy smoker, but quit 12 years ago.   Past Medical History  Diagnosis Date  . Pneumonia 2/14  . Vulvitis   . Substance abuse     smokes weed  . STD (sexually transmitted disease)     HSV  . Psoriasis    Family History  Problem Relation Age of Onset  . Lupus Sister 41  . Breast cancer Mother 36  . Hypertension Mother   . Stroke Father   . Hypertension Father   . Heart disease Neg Hx     ROS as in subjective     Objective: BP 148/90 mmHg  Pulse 90  Wt 156 lb (70.761 kg)  LMP 12/24/2000  BP Readings from Last 3 Encounters:  11/02/15 148/90  03/02/15 150/90  07/17/14 128/82   General appearance: alert, no distress, WD/WN, white female Neck: supple, no lymphadenopathy, no thyromegaly, no masses, no bruits Heart: RRR, normal S1, S2, no murmurs Lungs: CTA bilaterally, no wheezes, rhonchi, or rales Abdomen: +bs, soft, non tender, non distended, no masses, no hepatomegaly, no splenomegaly Extremities: no edema, no cyanosis, no clubbing Pulses: 2+ symmetric, upper and lower extremities, normal cap refill Neurological: alert,  oriented x 3, CN2-12 intact, strength normal upper extremities and lower extremities, sensation normal throughout, DTRs 2+ throughout, no cerebellar signs, gait normal Psychiatric: normal affect, behavior normal, pleasant     Assessment: Encounter Diagnoses  Name Primary?  . Essential hypertension Yes  . Chronic nonintractable headache, unspecified headache type      Plan: discussed BPs, only in recent months has had elevated pressures.   Reviewed the last 2 years of blood pressures.  She has chronic headaches.  Begin trial of Atenolol.  Discussed risks/benefits of medication, discussed lab orders, limiting salt, alcohol.  C/t vegetarian diet, routine exercise.  She declines EKG today.   F/u pending labs, then 73mo on BP  Maria Mann was seen today for bp elevated.  Diagnoses and all orders for this visit:  Essential hypertension -     Cancel: Basic metabolic panel -     TSH -     CBC -     atenolol (TENORMIN) 25 MG tablet; Take 1 tablet (25 mg total) by mouth daily. -     Comprehensive metabolic panel  Chronic nonintractable headache, unspecified headache type -     Cancel: Basic metabolic panel -     TSH -     CBC -     atenolol (TENORMIN) 25 MG tablet; Take  1 tablet (25 mg total) by mouth daily. -     Comprehensive metabolic panel

## 2015-11-03 LAB — COMPREHENSIVE METABOLIC PANEL
ALK PHOS: 118 U/L (ref 33–130)
ALT: 54 U/L — AB (ref 6–29)
AST: 39 U/L — AB (ref 10–35)
Albumin: 4.2 g/dL (ref 3.6–5.1)
BILIRUBIN TOTAL: 0.6 mg/dL (ref 0.2–1.2)
BUN: 8 mg/dL (ref 7–25)
CO2: 23 mmol/L (ref 20–31)
Calcium: 9.3 mg/dL (ref 8.6–10.4)
Chloride: 107 mmol/L (ref 98–110)
Creat: 0.93 mg/dL (ref 0.50–1.05)
GLUCOSE: 93 mg/dL (ref 65–99)
Potassium: 4.7 mmol/L (ref 3.5–5.3)
Sodium: 139 mmol/L (ref 135–146)
TOTAL PROTEIN: 6.6 g/dL (ref 6.1–8.1)

## 2015-11-03 LAB — CBC
HEMATOCRIT: 41.6 % (ref 35.0–45.0)
Hemoglobin: 14.3 g/dL (ref 11.7–15.5)
MCH: 32 pg (ref 27.0–33.0)
MCHC: 34.4 g/dL (ref 32.0–36.0)
MCV: 93.1 fL (ref 80.0–100.0)
MPV: 11.7 fL (ref 7.5–12.5)
PLATELETS: 207 10*3/uL (ref 140–400)
RBC: 4.47 MIL/uL (ref 3.80–5.10)
RDW: 14.2 % (ref 11.0–15.0)
WBC: 7.5 10*3/uL (ref 4.0–10.5)

## 2015-11-03 LAB — TSH: TSH: 1.96 mIU/L

## 2016-06-03 ENCOUNTER — Other Ambulatory Visit: Payer: Self-pay | Admitting: Nurse Practitioner

## 2016-06-03 DIAGNOSIS — Z1231 Encounter for screening mammogram for malignant neoplasm of breast: Secondary | ICD-10-CM

## 2016-06-15 ENCOUNTER — Ambulatory Visit
Admission: RE | Admit: 2016-06-15 | Discharge: 2016-06-15 | Disposition: A | Discharge status: Home / Self Care | Payer: BLUE CROSS/BLUE SHIELD | Source: Ambulatory Visit | Attending: Nurse Practitioner | Admitting: Nurse Practitioner

## 2016-06-15 DIAGNOSIS — Z1231 Encounter for screening mammogram for malignant neoplasm of breast: Secondary | ICD-10-CM

## 2017-05-26 ENCOUNTER — Other Ambulatory Visit: Payer: Self-pay | Admitting: Medical

## 2017-05-26 DIAGNOSIS — Z1231 Encounter for screening mammogram for malignant neoplasm of breast: Secondary | ICD-10-CM

## 2017-06-16 ENCOUNTER — Ambulatory Visit
Admission: RE | Admit: 2017-06-16 | Discharge: 2017-06-16 | Disposition: A | Discharge status: Home / Self Care | Payer: BLUE CROSS/BLUE SHIELD | Source: Ambulatory Visit | Attending: Medical | Admitting: Medical

## 2017-06-16 DIAGNOSIS — Z1231 Encounter for screening mammogram for malignant neoplasm of breast: Secondary | ICD-10-CM

## 2017-07-26 ENCOUNTER — Encounter: Payer: Self-pay | Admitting: Medical

## 2018-01-08 ENCOUNTER — Encounter: Payer: Self-pay | Admitting: Medical

## 2018-01-08 ENCOUNTER — Ambulatory Visit: Payer: BLUE CROSS/BLUE SHIELD | Admitting: Medical

## 2018-01-08 VITALS — BP 150/90 | HR 90 | Temp 98.1°F | Resp 16 | Ht 63.0 in | Wt 151.0 lb

## 2018-01-08 DIAGNOSIS — Z129 Encounter for screening for malignant neoplasm, site unspecified: Secondary | ICD-10-CM

## 2018-01-08 DIAGNOSIS — I10 Essential (primary) hypertension: Secondary | ICD-10-CM | POA: Diagnosis not present

## 2018-01-08 MED ORDER — RAMIPRIL 5 MG PO CAPS: 5.0000 mg | ORAL_CAPSULE | Freq: Every day | ORAL | 2 refills | Status: DC

## 2018-01-08 NOTE — Progress Notes (Signed)
Subjective: Chief Complaint  Patient presents with  . bp    elevated BP   Here for elevated BPs.  Last visit here over 2 years ago.  She typically does not like to come in for routine physicals and preventative care.  Has declined Pap smear and colonoscopy for years.  She has been feeling all that great lately and noticed that the pharmacy after checking her blood pressure it was elevated on 2 separate occasions.  At [facility] blood pressures were running 150/90s on a couple different occasions.  She denies chest pain, edema, shortness of breath, palpitations.  She is mostly vegetarian but eats some cheese.  She has felt some pounding in her ears are related to blood pressure.  Otherwise in usual state of health, Exercise - walking, weights, regularly.   Non smokier.   Drinks some red wine.  Past Medical History:  Diagnosis Date  . Pneumonia 2/14  . Psoriasis   . STD (sexually transmitted disease)    HSV  . Substance abuse (HCC)    smokes weed  . Vulvitis    Current Outpatient Medications on File Prior to Visit  Medication Sig Dispense Refill  . calcium carbonate (OS-CAL) 600 MG TABS Take 600 mg by mouth 2 (two) times daily with a meal.    . Cholecalciferol (VITAMIN D3) 20 MCG TABS Take 50 mcg by mouth.    . Cyanocobalamin (B-12) 2500 MCG TABS Take 2,500 each by mouth.    . Ferrous Sulfate (IRON SUPPLEMENT PO) Take 65 mcg by mouth.    . Multiple Vitamin (MULTIVITAMIN) tablet Take 1 tablet by mouth daily.    Marland Kitchen atenolol (TENORMIN) 25 MG tablet Take 1 tablet (25 mg total) by mouth daily. (Patient not taking: Reported on 01/08/2018) 30 tablet 2   No current facility-administered medications on file prior to visit.    ROS as in subjective   Objective: BP 138/84   Pulse 90   Temp 98.1 F (36.7 C) (Oral)   Resp 16   Ht 5\' 3"  (1.6 m)   Wt 151 lb (68.5 kg)   LMP 12/24/2000   SpO2 98%   BMI 26.75 kg/m   BP Readings from Last 3 Encounters:  01/08/18 138/84  11/02/15 (!) 148/90   03/02/15 (!) 150/90   Wt Readings from Last 3 Encounters:  01/08/18 151 lb (68.5 kg)  11/02/15 156 lb (70.8 kg)  03/02/15 154 lb (69.9 kg)   General appearance: alert, no distress, WD/WN,  Neck: supple, no lymphadenopathy, no thyromegaly, no masses, no bruits Heart: RRR, normal S1, S2, no murmurs Lungs: CTA bilaterally, no wheezes, rhonchi, or rales Pulses: 2+ symmetric, upper and lower extremities, normal cap refill Ext: no edema Neuro: CN2-12 intact   Adult ECG Report  Indication: HTN  Rate: 73 bpm  Rhythm: normal sinus rhythm  QRS Axis: 61 degrees  PR Interval:  QRS Duration: 84ms  QTc: 403 ms  Conduction Disturbances: none  Other Abnormalities: none  Patient's cardiac risk factors are: hypertension.  EKG comparison: none  Narrative Interpretation: normal EKG   Assessment: Encounter Diagnoses  Name Primary?  . Essential hypertension   . Screening for cancer Yes     Plan: Hypertension - Discussed diagnosis, possible complications, treatment recommendations including regular aerobic exercise, low fat low salt diet, dietary sodium restriction, working towards a health weight, and need for medications.  Begin medication as below.  Discussed risks/benefits of medication.  Check blood pressures 3 times weekly and record.  Follow  up in 4-6 weeks.  I recommend she consider cologurard and pap smear.  She will consider.   discussed benefits of screening and preventative care.   Maria LeatherwoodKatherine was seen today for bp.  Diagnoses and all orders for this visit:  Screening for cancer  Essential hypertension -     Comprehensive metabolic panel -     CBC -     TSH -     EKG 12-Lead -     Lipid panel  Other orders -     ramipril (ALTACE) 5 MG capsule; Take 1 capsule (5 mg total) by mouth daily.

## 2018-01-09 LAB — COMPREHENSIVE METABOLIC PANEL
ALT: 21 IU/L (ref 0–32)
AST: 23 IU/L (ref 0–40)
Albumin/Globulin Ratio: 2 (ref 1.2–2.2)
Albumin: 4.6 g/dL (ref 3.5–5.5)
Alkaline Phosphatase: 132 IU/L — ABNORMAL HIGH (ref 39–117)
BUN/Creatinine Ratio: 11 (ref 9–23)
BUN: 10 mg/dL (ref 6–24)
Bilirubin Total: 0.5 mg/dL (ref 0.0–1.2)
CALCIUM: 10.4 mg/dL — AB (ref 8.7–10.2)
CO2: 24 mmol/L (ref 20–29)
CREATININE: 0.92 mg/dL (ref 0.57–1.00)
Chloride: 106 mmol/L (ref 96–106)
GFR, EST AFRICAN AMERICAN: 79 mL/min/{1.73_m2} (ref >59)
GFR, EST NON AFRICAN AMERICAN: 68 mL/min/{1.73_m2} (ref >59)
Globulin, Total: 2.3 g/dL (ref 1.5–4.5)
Glucose: 83 mg/dL (ref 65–99)
Potassium: 4.9 mmol/L (ref 3.5–5.2)
Sodium: 145 mmol/L — ABNORMAL HIGH (ref 134–144)
Total Protein: 6.9 g/dL (ref 6.0–8.5)

## 2018-01-09 LAB — CBC
HEMATOCRIT: 42.9 % (ref 34.0–46.6)
HEMOGLOBIN: 14.7 g/dL (ref 11.1–15.9)
MCH: 30.7 pg (ref 26.6–33.0)
MCHC: 34.3 g/dL (ref 31.5–35.7)
MCV: 90 fL (ref 79–97)
Platelets: 203 10*3/uL (ref 150–450)
RBC: 4.79 x10E6/uL (ref 3.77–5.28)
RDW: 13.7 % (ref 12.3–15.4)
WBC: 7.7 10*3/uL (ref 3.4–10.8)

## 2018-01-09 LAB — TSH: TSH: 1.4 u[IU]/mL (ref 0.450–4.500)

## 2018-01-09 LAB — LIPID PANEL
CHOL/HDL RATIO: 3.3 ratio (ref 0.0–4.4)
Cholesterol, Total: 179 mg/dL (ref 100–199)
HDL: 55 mg/dL (ref >39)
LDL CALC: 106 mg/dL — AB (ref 0–99)
Triglycerides: 91 mg/dL (ref 0–149)
VLDL Cholesterol Cal: 18 mg/dL (ref 5–40)

## 2018-02-06 ENCOUNTER — Ambulatory Visit: Payer: BLUE CROSS/BLUE SHIELD | Admitting: Medical

## 2018-02-06 ENCOUNTER — Encounter: Payer: Self-pay | Admitting: Medical

## 2018-02-06 VITALS — BP 130/80 | HR 79 | Temp 98.3°F | Resp 16 | Ht 63.0 in | Wt 153.2 lb

## 2018-02-06 DIAGNOSIS — E559 Vitamin D deficiency, unspecified: Secondary | ICD-10-CM

## 2018-02-06 DIAGNOSIS — H833X3 Noise effects on inner ear, bilateral: Secondary | ICD-10-CM

## 2018-02-06 DIAGNOSIS — I10 Essential (primary) hypertension: Secondary | ICD-10-CM | POA: Diagnosis not present

## 2018-02-06 DIAGNOSIS — Z23 Encounter for immunization: Secondary | ICD-10-CM | POA: Insufficient documentation

## 2018-02-06 DIAGNOSIS — R5383 Other fatigue: Secondary | ICD-10-CM

## 2018-02-06 MED ORDER — ATENOLOL 25 MG PO TABS: 25.0000 mg | ORAL_TABLET | Freq: Every day | ORAL | 2 refills | Status: DC

## 2018-02-06 NOTE — Progress Notes (Signed)
Subjective: Chief Complaint  Patient presents with  . follow up    follow up htn   Last visit we begin Ramipril for diagnosis of hypertension and sensation of pounding or whooshing in ears.   She denies allergy or sinus problems, no headaches.  She is compliant with Ramipril but no improvement in symptoms.  BPs running ok.  She was prescirbed atenolol in the past but never took it.   She is also compliant with vit D 1000 unites started last visit.    She denies chest pain, edema, shortness of breath, palpitations.  She is mostly vegetarian but eats some cheese.   Otherwise in usual state of health, Exercise - walking, weights, regularly.   Non smokier.   Drinks some red wine.  Past Medical History:  Diagnosis Date  . Pneumonia 2/14  . Psoriasis   . STD (sexually transmitted disease)    HSV  . Substance abuse (HCC)    smokes weed  . Vulvitis    Current Outpatient Medications on File Prior to Visit  Medication Sig Dispense Refill  . calcium carbonate (OS-CAL) 600 MG TABS Take 600 mg by mouth 2 (two) times daily with a meal.    . Cholecalciferol (VITAMIN D3) 20 MCG TABS Take 50 mcg by mouth.    . Cyanocobalamin (B-12) 2500 MCG TABS Take 2,500 each by mouth.    . Ferrous Sulfate (IRON SUPPLEMENT PO) Take 65 mcg by mouth.    . Multiple Vitamin (MULTIVITAMIN) tablet Take 1 tablet by mouth daily.     No current facility-administered medications on file prior to visit.    ROS as in subjective    Objective: BP 130/80   Pulse 79   Temp 98.3 F (36.8 C) (Oral)   Resp 16   Ht 5\' 3"  (1.6 m)   Wt 153 lb 3.2 oz (69.5 kg)   LMP 12/24/2000   SpO2 98%   BMI 27.14 kg/m   BP Readings from Last 3 Encounters:  02/06/18 130/80  01/08/18 (!) 150/90  11/02/15 (!) 148/90   Wt Readings from Last 3 Encounters:  02/06/18 153 lb 3.2 oz (69.5 kg)  01/08/18 151 lb (68.5 kg)  11/02/15 156 lb (70.8 kg)   General appearance: alert, no distress, WD/WN,  HENT unremarkable Neck: supple, no  lymphadenopathy, no thyromegaly, no masses, no bruits Heart: RRR, normal S1, S2, no murmurs Lungs: CTA bilaterally, no wheezes, rhonchi, or rales Pulses: 2+ symmetric, upper and lower extremities, normal cap refill Ext: no edema Neuro: CN2-12 intact    Assessment: Encounter Diagnoses  Name Primary?  . Essential hypertension Yes  . Vitamin D deficiency   . Need for influenza vaccination   . Sound sensitivity in both ears   . Other fatigue      Plan: Hypertension - stop Ramipril and change to Atenolol to see if any improvement in symptoms of whooshing sound in ears.  Monitor BP, f/u in 2-3 wk.  Vit D deficiency - c/t Vit D 1000u daily  Counseled on the influenza virus vaccine.  Vaccine information sheet given.  Influenza vaccine given after consent obtained.   Terea was seen today for follow up.  Diagnoses and all orders for this visit:  Essential hypertension  Vitamin D deficiency  Need for influenza vaccination  Sound sensitivity in both ears  Other fatigue  Other orders -     Discontinue: atenolol (TENORMIN) 25 MG tablet; Take 1 tablet (25 mg total) by mouth daily. -  atenolol (TENORMIN) 25 MG tablet; Take 1 tablet (25 mg total) by mouth daily.

## 2018-02-07 NOTE — Addendum Note (Signed)
Addended by: Derinda Late on: 02/07/2018 08:02 AM   Modules accepted: Orders

## 2018-02-28 ENCOUNTER — Other Ambulatory Visit: Payer: Self-pay | Admitting: Medical

## 2018-02-28 NOTE — Telephone Encounter (Signed)
Is this ok to refill?  

## 2018-06-03 ENCOUNTER — Other Ambulatory Visit: Payer: Self-pay | Admitting: Medical

## 2018-06-04 ENCOUNTER — Other Ambulatory Visit: Payer: Self-pay | Admitting: Medical

## 2018-06-04 DIAGNOSIS — Z1231 Encounter for screening mammogram for malignant neoplasm of breast: Secondary | ICD-10-CM

## 2018-07-02 ENCOUNTER — Ambulatory Visit
Admission: RE | Admit: 2018-07-02 | Discharge: 2018-07-02 | Disposition: A | Discharge status: Home / Self Care | Payer: BLUE CROSS/BLUE SHIELD | Source: Ambulatory Visit | Attending: Medical | Admitting: Medical

## 2018-07-02 DIAGNOSIS — Z1231 Encounter for screening mammogram for malignant neoplasm of breast: Secondary | ICD-10-CM

## 2018-07-31 ENCOUNTER — Telehealth: Payer: Self-pay | Admitting: Medical

## 2018-07-31 NOTE — Telephone Encounter (Signed)
Set up for virtual visit regarding BP med, labs from last visit.  See if she can list some BP readings at home too.

## 2018-07-31 NOTE — Telephone Encounter (Signed)
Left message for pt

## 2018-08-28 ENCOUNTER — Other Ambulatory Visit: Payer: Self-pay | Admitting: Medical

## 2018-08-29 ENCOUNTER — Other Ambulatory Visit: Payer: Self-pay

## 2018-08-29 ENCOUNTER — Ambulatory Visit (INDEPENDENT_AMBULATORY_CARE_PROVIDER_SITE_OTHER): Payer: BLUE CROSS/BLUE SHIELD | Admitting: Medical

## 2018-08-29 ENCOUNTER — Encounter: Payer: Self-pay | Admitting: Medical

## 2018-08-29 VITALS — Ht 63.0 in | Wt 150.0 lb

## 2018-08-29 DIAGNOSIS — E559 Vitamin D deficiency, unspecified: Secondary | ICD-10-CM | POA: Diagnosis not present

## 2018-08-29 DIAGNOSIS — I10 Essential (primary) hypertension: Secondary | ICD-10-CM | POA: Diagnosis not present

## 2018-08-29 DIAGNOSIS — Z7189 Other specified counseling: Secondary | ICD-10-CM

## 2018-08-29 DIAGNOSIS — Z7185 Encounter for immunization safety counseling: Secondary | ICD-10-CM | POA: Insufficient documentation

## 2018-08-29 DIAGNOSIS — Z1211 Encounter for screening for malignant neoplasm of colon: Secondary | ICD-10-CM | POA: Diagnosis not present

## 2018-08-29 MED ORDER — ATENOLOL 25 MG PO TABS: 25.0000 mg | ORAL_TABLET | Freq: Every day | ORAL | 3 refills | Status: DC

## 2018-08-29 NOTE — Progress Notes (Signed)
This visit type was conducted due to national recommendations for restrictions regarding the COVID-19 Pandemic (e.g. social distancing) in an effort to limit this patient's exposure and mitigate transmission in our community.  Due to their co-morbid illnesses, this patient is at least at moderate risk for complications without adequate follow up.  This format is felt to be most appropriate for this patient at this time.    Documentation for virtual audio and video telecommunications through Zoom encounter:  The patient was located at home. The provider was located in the office. The patient did consent to this visit and is aware of possible charges through their insurance for this visit.  The other persons participating in this telemedicine service were none. Time spent on call was 15 minutes and in review of previous records >20 minutes total.  This virtual service is not related to other E/M service within previous 7 days.  Subjective: Chief Complaint  Patient presents with  . med check    med check    Virtual visit today for BP check.  Compliant with atenolol without c/o.  Home Bp readings normal.  She is also compliant with vit D 1000 units daily  She denies chest pain, edema, shortness of breath, palpitations.  She is mostly vegetarian but eats some cheese.   Otherwise in usual state of health, Exercise - walking, weights, regularly.   Non smokier.   Drinks some red wine.  Past Medical History:  Diagnosis Date  . Pneumonia 2/14  . Psoriasis   . STD (sexually transmitted disease)    HSV  . Substance abuse (HCC)    smokes weed  . Vulvitis    Current Outpatient Medications on File Prior to Visit  Medication Sig Dispense Refill  . calcium carbonate (OS-CAL) 600 MG TABS Take 600 mg by mouth 2 (two) times daily with a meal.    . Cholecalciferol (VITAMIN D3) 20 MCG TABS Take 50 mcg by mouth.    . Cyanocobalamin (B-12) 2500 MCG TABS Take 2,500 each by mouth.    . Multiple  Vitamin (MULTIVITAMIN) tablet Take 1 tablet by mouth daily.    . Ferrous Sulfate (IRON SUPPLEMENT PO) Take 65 mcg by mouth.     No current facility-administered medications on file prior to visit.    ROS as in subjective    Objective: Ht 5\' 3"  (1.6 m)   Wt 150 lb (68 kg)   LMP 12/24/2000   BMI 26.57 kg/m   Not examined in person, but nad   Assessment: Encounter Diagnoses  Name Primary?  . Essential hypertension Yes  . Vitamin D deficiency   . Screen for colon cancer   . Vaccine counseling      Plan: Hypertension - c/t Atenolol, monitor home BP.  C/t good exercise, vegetarian diet.  Vit D deficiency - c/t Vit D 1000u daily  Shingles vaccine:  I recommend you have a shingles vaccine to help prevent shingles or herpes zoster outbreak.   Please call your insurer to inquire about coverage for the Shingrix vaccine given in 2 doses.   Some insurers cover this vaccine after age 57, some cover this after age 26.  If your insurer covers this, then call to schedule appointment to have this vaccine here.  Referral for cologuard.   Ainsleigh was seen today for med check.  Diagnoses and all orders for this visit:  Essential hypertension  Vitamin D deficiency  Screen for colon cancer  Vaccine counseling  Other orders -  atenolol (TENORMIN) 25 MG tablet; Take 1 tablet (25 mg total) by mouth daily.

## 2018-08-30 ENCOUNTER — Other Ambulatory Visit: Payer: Self-pay

## 2018-08-30 DIAGNOSIS — Z1211 Encounter for screening for malignant neoplasm of colon: Secondary | ICD-10-CM

## 2018-08-30 NOTE — Progress Notes (Signed)
done

## 2019-07-16 IMAGING — MG DIGITAL SCREENING BILATERAL MAMMOGRAM WITH TOMO AND CAD
8 series · 9 of 24 positions shown · non-contrast
Comparison: Previous exam(s).

CLINICAL DATA: Screening.

EXAM:
DIGITAL SCREENING BILATERAL MAMMOGRAM WITH TOMO AND CAD

[L MLO synth-2D]
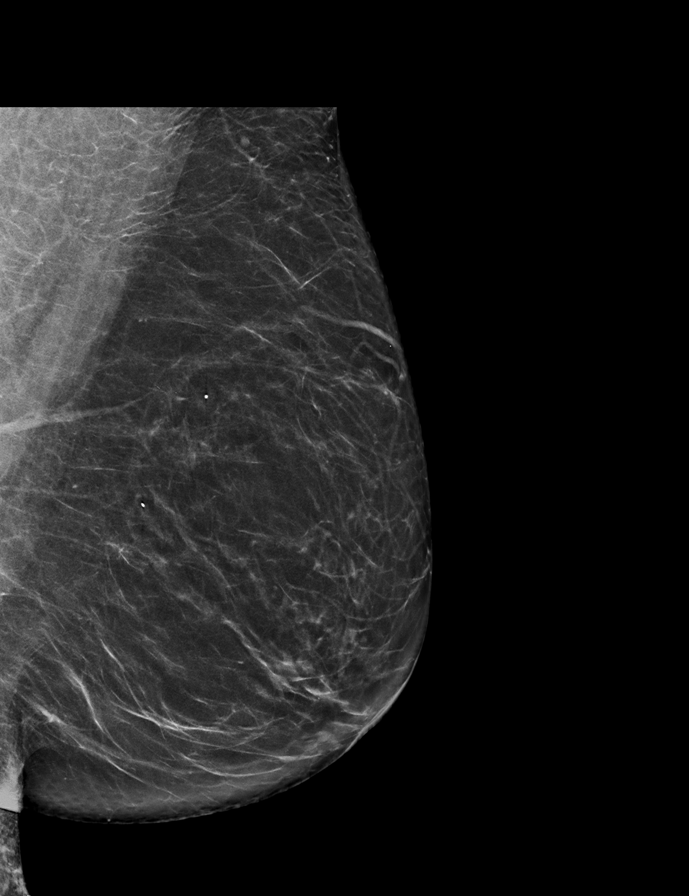

[R MLO synth-2D]
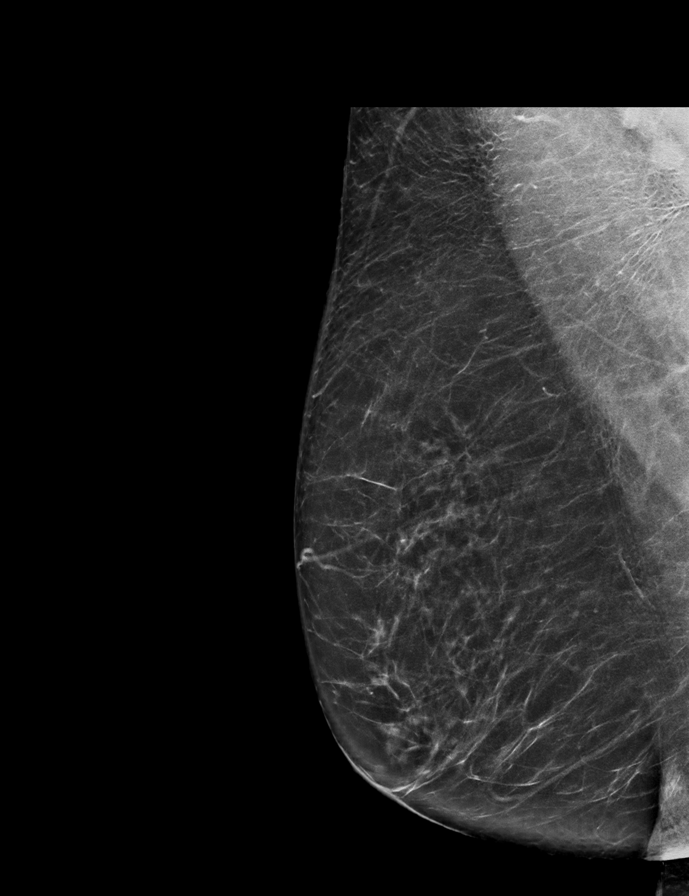

[L CC synth-2D]
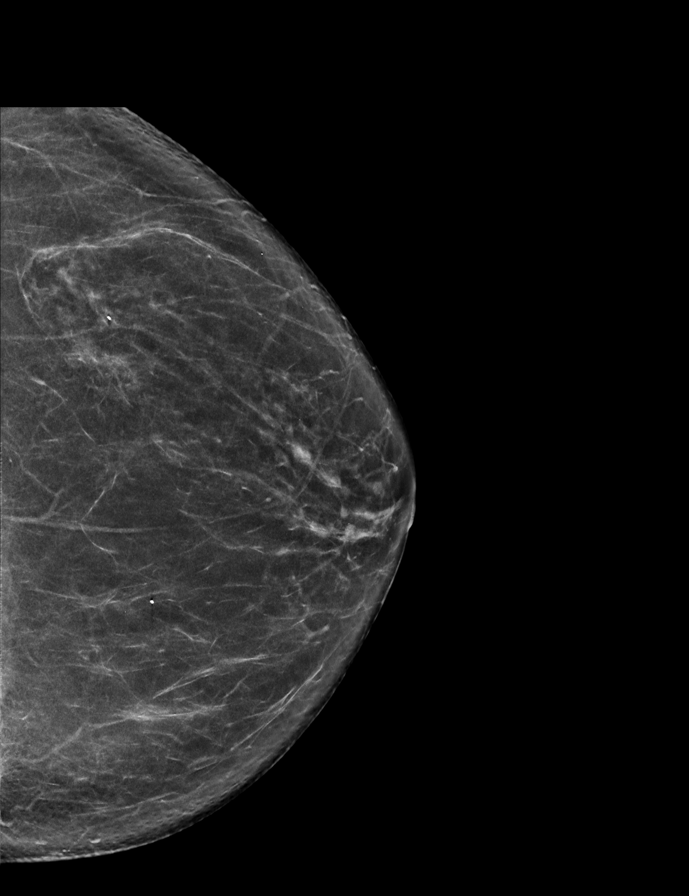

[R CC synth-2D]
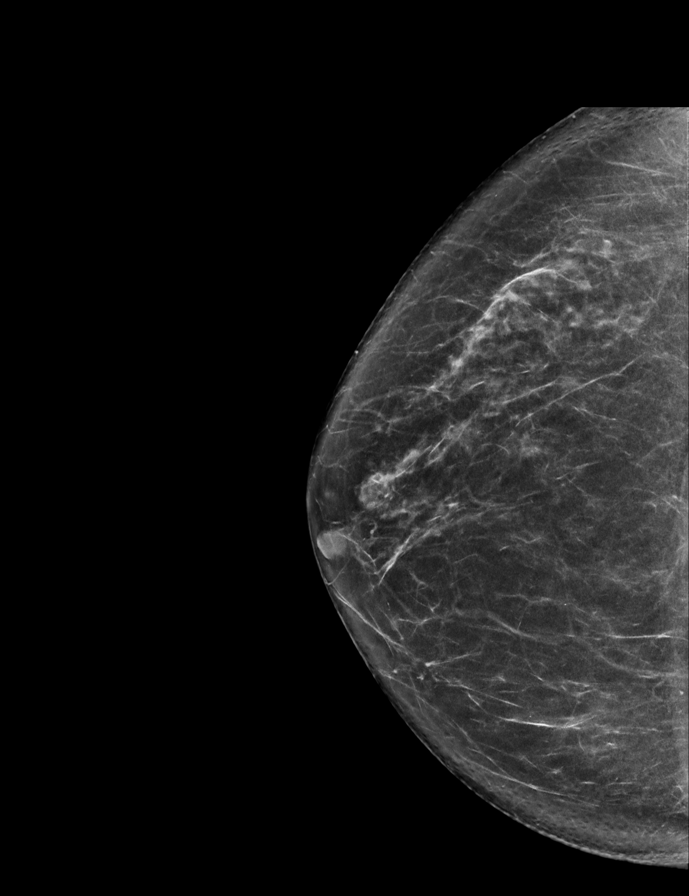

[R MLO tomo · 2 of 77 frames shown]
[frame 25/77]
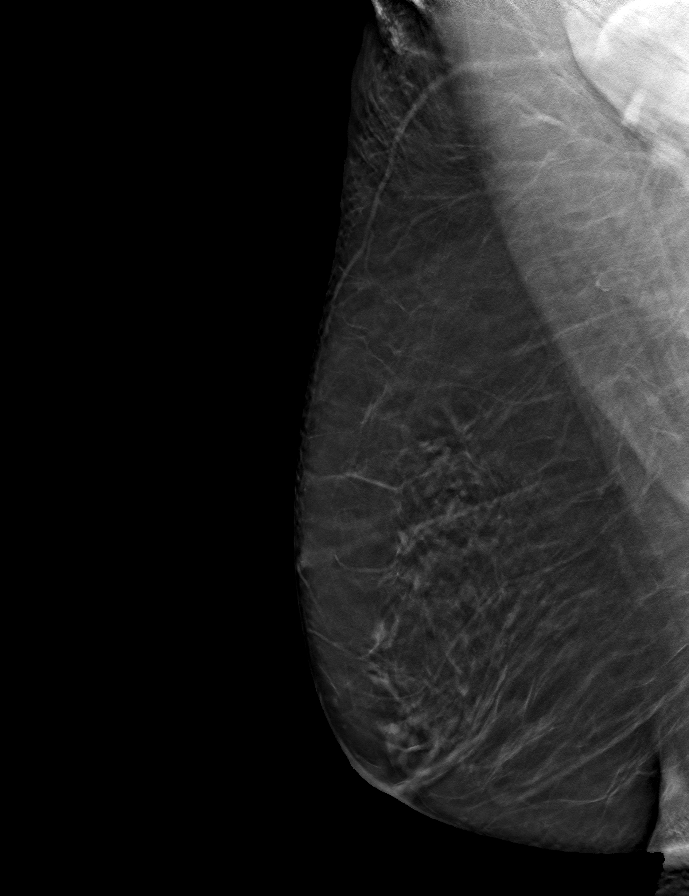
[frame 39/77]
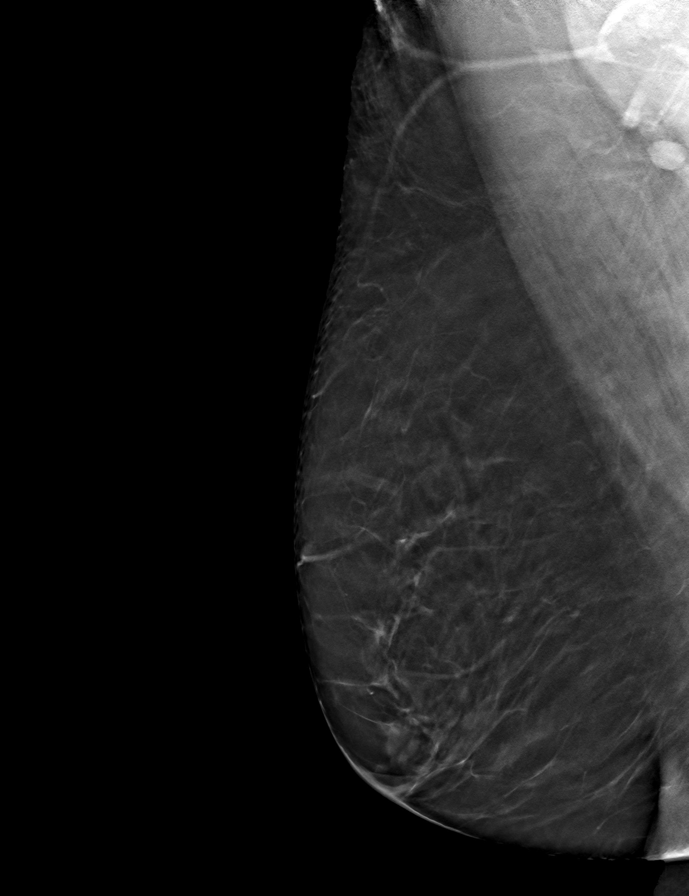

[L CC tomo · tomo slice 35/68.0]
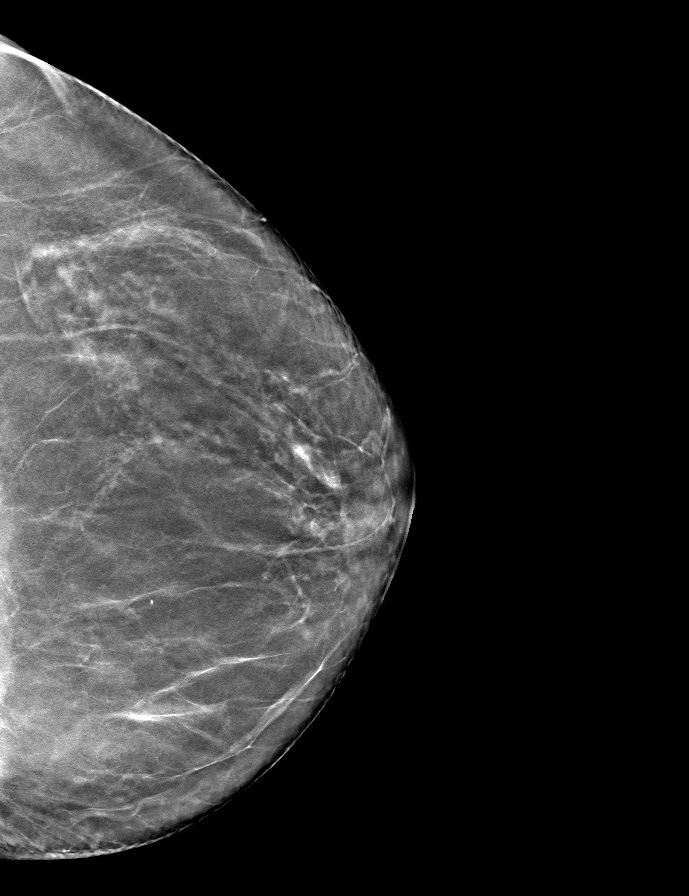

[R CC tomo · tomo slice 37/74.0]
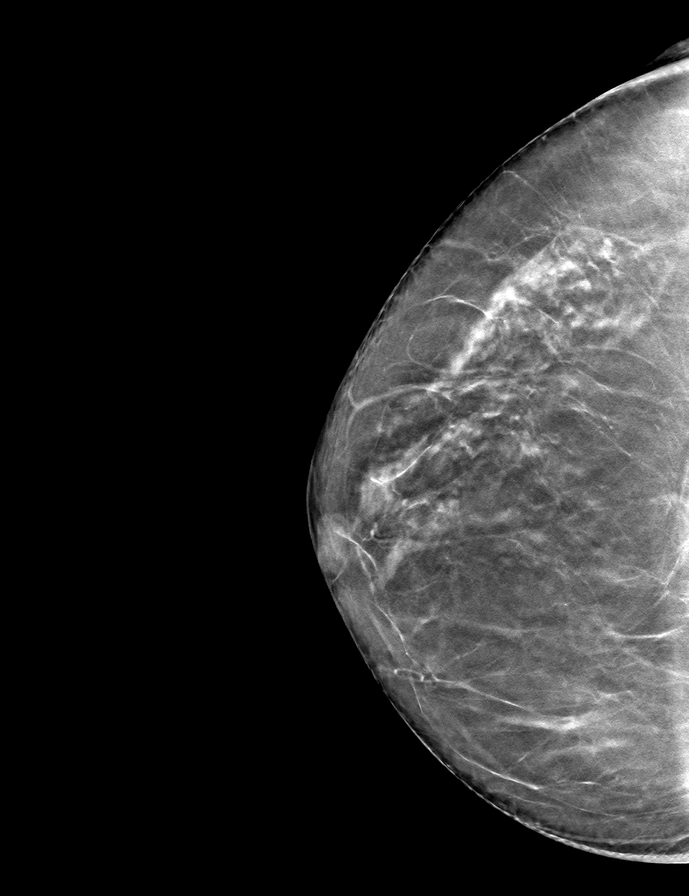

[L MLO tomo · tomo slice 37/72.0]
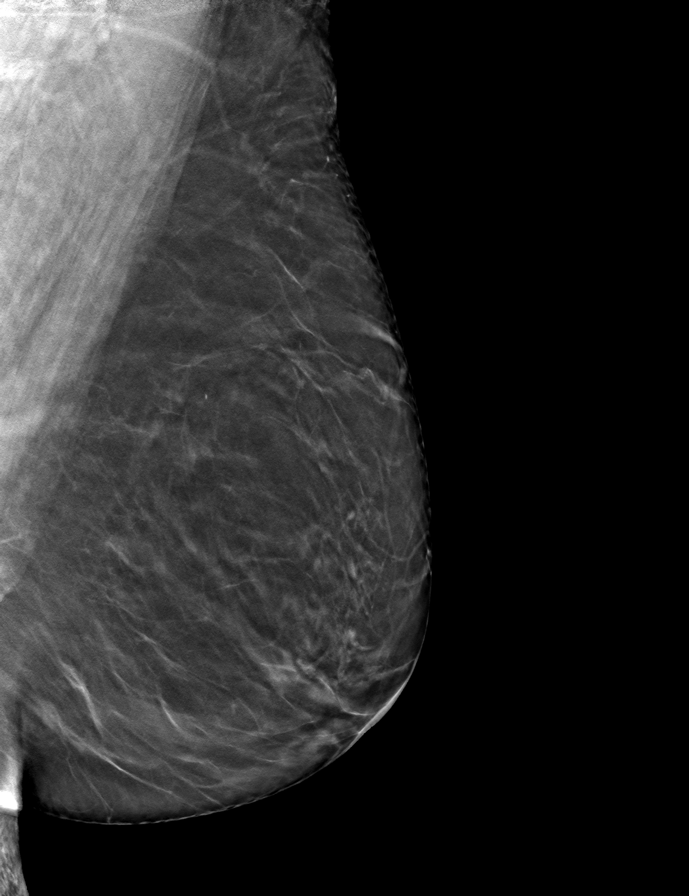

[9 of 24 positions shown; findings below may reference images not displayed]

ACR Breast Density Category b: There are scattered areas of
fibroglandular density.
FINDINGS: There are no findings suspicious for malignancy. Images were
processed with CAD.
IMPRESSION: No mammographic evidence of malignancy. A result letter of this
screening mammogram will be mailed directly to the patient.

RECOMMENDATION:
Screening mammogram in one year. (Code:[id number])

BI-RADS CATEGORY  1: Negative.

## 2019-09-02 ENCOUNTER — Telehealth: Payer: Self-pay | Admitting: Medical

## 2019-09-02 NOTE — Telephone Encounter (Signed)
I received a notice that her Cologuard order expired.  She is also due for fasting physical.  Please schedule

## 2019-09-03 NOTE — Telephone Encounter (Signed)
Sent patient a message via mychart to call and schedule her appointment. Cologuard can be ordered again at that time.

## 2019-09-26 ENCOUNTER — Telehealth: Payer: Self-pay | Admitting: Medical

## 2019-09-26 NOTE — Telephone Encounter (Signed)
Pt needs refill on Atenolol sent to the [facility] on [address].

## 2019-09-27 ENCOUNTER — Other Ambulatory Visit: Payer: Self-pay | Admitting: Medical

## 2019-09-27 MED ORDER — ATENOLOL 25 MG PO TABS: 25.0000 mg | ORAL_TABLET | Freq: Every day | ORAL | 0 refills | Status: AC

## 2019-09-27 NOTE — Telephone Encounter (Signed)
Med sent.  She is due for fasting physical. Please schedule

## 2019-09-30 NOTE — Telephone Encounter (Signed)
Left pt vm to call and schedule

## 2023-07-28 ENCOUNTER — Ambulatory Visit
Admission: RE | Admit: 2023-07-28 | Discharge: 2023-07-28 | Disposition: A | Discharge status: Home / Self Care | Source: Ambulatory Visit | Attending: Family Medicine | Admitting: Family Medicine

## 2023-07-28 VITALS — BP 127/85 | HR 125 | Temp 98.5°F | Resp 22

## 2023-07-28 DIAGNOSIS — N390 Urinary tract infection, site not specified: Secondary | ICD-10-CM | POA: Diagnosis present

## 2023-07-28 DIAGNOSIS — R102 Pelvic and perineal pain: Secondary | ICD-10-CM | POA: Diagnosis present

## 2023-07-28 LAB — POCT URINALYSIS DIP (MANUAL ENTRY)
Glucose, UA: NEGATIVE mg/dL
Nitrite, UA: POSITIVE — AB
Protein Ur, POC: >=300 mg/dL — AB
Spec Grav, UA: >=1.03 — AB (ref 1.010–1.025)
Urobilinogen, UA: 0.2 U/dL
pH, UA: 5.5 (ref 5.0–8.0)

## 2023-07-28 MED ORDER — CEPHALEXIN 500 MG PO CAPS: 500.0000 mg | ORAL_CAPSULE | Freq: Two times a day (BID) | ORAL | 0 refills | Status: AC

## 2023-07-28 MED ORDER — ONDANSETRON 4 MG PO TBDP: 4.0000 mg | ORAL_TABLET | Freq: Three times a day (TID) | ORAL | 0 refills | Status: AC | PRN

## 2023-07-28 NOTE — ED Triage Notes (Signed)
 Pt reports she has lower abdominal pain x 3 days   States she ate bad food x 3 days ago

## 2023-07-28 NOTE — Discharge Instructions (Signed)
 Take the antibiotic as prescribed and the nausea medication as needed.  Drink plenty of fluids, take Tylenol and use heating pads as needed for pain relief and follow-up for worsening symptoms at any time.  We will give you a call if your urine culture tells [place] that we need to make any changes to your medications.

## 2023-07-28 NOTE — ED Provider Notes (Signed)
 [facility]    CSN: [id number] Arrival date & time: 07/28/23  1157      History   Chief Complaint Chief Complaint  Patient presents with   Abdominal Pain    Entered by patient    HPI Maria Mann is a 63 y.o. female.   Presenting today with 3-day history of lower abdominal pain, fatigue, dysuria.  She states she ate something that did not agree with her about 3 days ago and has since had these developing symptoms.  So far not trying anything over-the-counter for symptoms.  No known history of chronic GI issues.    Past Medical History:  Diagnosis Date   Pneumonia 2/14   Psoriasis    STD (sexually transmitted disease)    HSV   Substance abuse (HCC)    smokes weed   Vulvitis     Patient Active Problem List   Diagnosis Date Noted   Vaccine counseling 08/29/2018   Screen for colon cancer 08/29/2018   Vitamin D deficiency 02/06/2018   Need for influenza vaccination 02/06/2018   Sound sensitivity in both ears 02/06/2018   Other fatigue 02/06/2018   Hypertension 01/08/2018   MRSA (methicillin resistant staph aureus) culture positive 07/15/2014    Past Surgical History:  Procedure Laterality Date   bartholian cyst  09/2003   COLPOSCOPY     CRYOABLATION     CIN I     OB History     Gravida  0   Para      Term      Preterm      AB      Living         SAB      IAB      Ectopic      Multiple      Live Births               Home Medications    Prior to Admission medications   Medication Sig Start Date End Date Taking? Authorizing Provider  cephALEXin (KEFLEX) 500 MG capsule Take 1 capsule (500 mg total) by mouth 2 (two) times daily. 07/28/23  Yes Particia Nearing, PA-C  ondansetron (ZOFRAN-ODT) 4 MG disintegrating tablet Take 1 tablet (4 mg total) by mouth every 8 (eight) hours as needed for nausea or vomiting. 07/28/23  Yes Particia Nearing, PA-C  atenolol (TENORMIN) 25 MG tablet Take 1 tablet (25 mg total) by mouth  daily. 09/27/19   Tysinger, Kermit Balo, PA-C  calcium carbonate (OS-CAL) 600 MG TABS Take 600 mg by mouth 2 (two) times daily with a meal.    [provider]  Cholecalciferol (VITAMIN D3) 20 MCG TABS Take 50 mcg by mouth.    [provider]  Cyanocobalamin (B-12) 2500 MCG TABS Take 2,500 each by mouth.    [provider]  Ferrous Sulfate (IRON SUPPLEMENT PO) Take 65 mcg by mouth.    [provider]  Multiple Vitamin (MULTIVITAMIN) tablet Take 1 tablet by mouth daily.    [provider]    Family History Family History  Problem Relation Age of Onset   Lupus Sister 23   Breast cancer Mother 6   Hypertension Mother    Stroke Father    Hypertension Father    Heart disease Neg Hx     Social History Social History   Tobacco Use   Smoking status: Former    Current packs/day: 0.00    Average packs/day: 0.3 packs/day for 10.0  years (2.5 ttl pk-yrs)    Types: Cigarettes    Start date: 04/25/1993    Quit date: 04/26/2003    Years since quitting: 20.2   Smokeless tobacco: Never  Substance Use Topics   Alcohol use: Yes    Alcohol/week: 7.0 standard drinks of alcohol    Types: 7 Glasses of wine per week   Drug use: No     Allergies   Codeine and Bactrim [sulfamethoxazole-trimethoprim]   Review of Systems Review of Systems Per HPI  Physical Exam Triage Vital Signs ED Triage Vitals  Encounter Vitals Group     BP 07/28/23 1206 127/85     Systolic BP Percentile --      Diastolic BP Percentile --      Pulse Rate 07/28/23 1206 (!) 120     Resp 07/28/23 1206 (!) 26     Temp 07/28/23 1206 98.5 F (36.9 C)     Temp Source 07/28/23 1206 Oral     SpO2 07/28/23 1206 97 %     Weight --      Height --      Head Circumference --      Peak Flow --      Pain Score 07/28/23 1205 7     Pain Loc --      Pain Education --      Exclude from Growth Chart --    No data found.  Updated Vital Signs BP 127/85 (BP Location: Right Arm)   Pulse (S)  (!) 125 Comment: upon recheck  Temp 98.5 F (36.9 C) (Oral)   Resp (S) (!) 22 Comment: upon recheck  LMP 12/24/2000   SpO2 97%   Visual Acuity Right Eye Distance:   Left Eye Distance:   Bilateral Distance:    Right Eye Near:   Left Eye Near:    Bilateral Near:     Physical Exam Vitals and nursing note reviewed.  Constitutional:      Appearance: Normal appearance. She is not ill-appearing.  HENT:     Head: Atraumatic.  Eyes:     Extraocular Movements: Extraocular movements intact.     Conjunctiva/sclera: Conjunctivae normal.  Cardiovascular:     Rate and Rhythm: Tachycardia present.  Pulmonary:     Effort: Pulmonary effort is normal.  Abdominal:     General: Bowel sounds are normal. There is no distension.     Palpations: Abdomen is soft.     Tenderness: There is abdominal tenderness. There is no right CVA tenderness, left CVA tenderness or guarding.     Comments: Lower abdominal tenderness to palpation without distention or guarding  Musculoskeletal:        General: Normal range of motion.     Cervical back: Normal range of motion and neck supple.  Skin:    General: Skin is warm and dry.  Neurological:     Mental Status: She is alert and oriented to person, place, and time.  Psychiatric:        Mood and Affect: Mood normal.        Thought Content: Thought content normal.        Judgment: Judgment normal.      UC Treatments / Results  Labs (all labs ordered are listed, but only abnormal results are displayed) Labs Reviewed  POCT URINALYSIS DIP (MANUAL ENTRY) - Abnormal; Notable for the following components:      Result Value   Color, UA orange (*)    Bilirubin, UA moderate (*)  Ketones, POC UA small (15) (*)    Spec Grav, UA >=1.030 (*)    Blood, UA trace-lysed (*)    Protein Ur, POC >=300 (*)    Nitrite, UA Positive (*)    Leukocytes, UA Trace (*)    All other components within normal limits  URINE CULTURE    EKG   Radiology No results  found.  Procedures Procedures (including critical care time)  Medications Ordered in UC Medications - No data to display  Initial Impression / Assessment and Plan / UC Course  I have reviewed the triage vital signs and the nursing notes.  Pertinent labs & imaging results that were available during my care of the patient were reviewed by me and considered in my medical decision making (see chart for details).     Urinalysis with evidence of urinary tract infection, unclear if also a kidney stone at this point.  Will treat with Keflex, await urine culture, Zofran as needed for nausea, supportive over-the-counter medications and home care.  Return for any worsening symptoms. Final Clinical Impressions(s) / UC Diagnoses   Final diagnoses:  Acute lower UTI  Suprapubic pain     Discharge Instructions      Take the antibiotic as prescribed and the nausea medication as needed.  Drink plenty of fluids, take Tylenol and use heating pads as needed for pain relief and follow-up for worsening symptoms at any time.  We will give you a call if your urine culture tells [place] that we need to make any changes to your medications.    ED Prescriptions     Medication Sig Dispense Auth. Provider   cephALEXin (KEFLEX) 500 MG capsule Take 1 capsule (500 mg total) by mouth 2 (two) times daily. 10 capsule Particia Nearing, PA-C   ondansetron (ZOFRAN-ODT) 4 MG disintegrating tablet Take 1 tablet (4 mg total) by mouth every 8 (eight) hours as needed for nausea or vomiting. 20 tablet Particia Nearing, [place]      PDMP not reviewed this encounter.   Particia Nearing, [place] 07/28/23 1706

## 2023-07-31 LAB — URINE CULTURE

## 2023-08-11 ENCOUNTER — Ambulatory Visit
Admission: EM | Admit: 2023-08-11 | Discharge: 2023-08-11 | Disposition: A | Discharge status: Home / Self Care | Attending: Family Medicine | Admitting: Family Medicine

## 2023-08-11 DIAGNOSIS — N390 Urinary tract infection, site not specified: Secondary | ICD-10-CM | POA: Diagnosis present

## 2023-08-11 LAB — POCT URINALYSIS DIP (MANUAL ENTRY)
Bilirubin, UA: NEGATIVE
Blood, UA: NEGATIVE
Glucose, UA: NEGATIVE mg/dL
Nitrite, UA: POSITIVE — AB
Protein Ur, POC: 100 mg/dL — AB
Spec Grav, UA: >=1.03 — AB (ref 1.010–1.025)
Urobilinogen, UA: 0.2 U/dL
pH, UA: 5 (ref 5.0–8.0)

## 2023-08-11 MED ORDER — CIPROFLOXACIN HCL 500 MG PO TABS: 500.0000 mg | ORAL_TABLET | Freq: Two times a day (BID) | ORAL | 0 refills | Status: AC

## 2023-08-11 NOTE — ED Triage Notes (Signed)
 Pt reports she has lower abdominal pain, low back pain, frequent urination, and  a fever x 3 day

## 2023-08-11 NOTE — ED Provider Notes (Signed)
 [facility]    CSN: [id number] Arrival date & time: 08/11/23  1445      History   Chief Complaint No chief complaint on file.   HPI Maria Mann is a 63 y.o. female.   Presenting today with 3-day history of lower abdominal pressure, urinary frequency, low-grade fevers.  Was seen several weeks ago and treated with Keflex  for a urinary tract infection, urine culture showed multiple species and recommended recollection but recollection was not performed.  She states she felt completely resolved from that and now having new set of symptoms 3 days ago.  Denies nausea, vomiting, hematuria, bowel changes.    Past Medical History:  Diagnosis Date   Pneumonia 2/14   Psoriasis    STD (sexually transmitted disease)    HSV   Substance abuse (HCC)    smokes weed   Vulvitis     Patient Active Problem List   Diagnosis Date Noted   Vaccine counseling 08/29/2018   Screen for colon cancer 08/29/2018   Vitamin D deficiency 02/06/2018   Need for influenza vaccination 02/06/2018   Sound sensitivity in both ears 02/06/2018   Other fatigue 02/06/2018   Hypertension 01/08/2018   MRSA (methicillin resistant staph aureus) culture positive 07/15/2014    Past Surgical History:  Procedure Laterality Date   bartholian cyst  09/2003   COLPOSCOPY     CRYOABLATION     CIN I     OB History     Gravida  0   Para      Term      Preterm      AB      Living         SAB      IAB      Ectopic      Multiple      Live Births               Home Medications    Prior to Admission medications   Medication Sig Start Date End Date Taking? Authorizing Provider  ciprofloxacin  (CIPRO ) 500 MG tablet Take 1 tablet (500 mg total) by mouth 2 (two) times daily. 08/11/23  Yes Corbin Dess, PA-C  atenolol  (TENORMIN ) 25 MG tablet Take 1 tablet (25 mg total) by mouth daily. 09/27/19   Tysinger, Christiane Cowing, PA-C  calcium carbonate (OS-CAL) 600 MG TABS Take 600 mg by  mouth 2 (two) times daily with a meal.    [provider]  cephALEXin  (KEFLEX ) 500 MG capsule Take 1 capsule (500 mg total) by mouth 2 (two) times daily. 07/28/23   Corbin Dess, PA-C  Cholecalciferol (VITAMIN D3) 20 MCG TABS Take 50 mcg by mouth.    [provider]  Cyanocobalamin (B-12) 2500 MCG TABS Take 2,500 each by mouth.    [provider]  Ferrous Sulfate (IRON SUPPLEMENT PO) Take 65 mcg by mouth.    [provider]  Multiple Vitamin (MULTIVITAMIN) tablet Take 1 tablet by mouth daily.    [provider]  ondansetron  (ZOFRAN -ODT) 4 MG disintegrating tablet Take 1 tablet (4 mg total) by mouth every 8 (eight) hours as needed for nausea or vomiting. 07/28/23   Corbin Dess, PA-C    Family History Family History  Problem Relation Age of Onset   Lupus Sister 44   Breast cancer Mother 38   Hypertension Mother    Stroke Father    Hypertension Father    Heart disease Neg Hx  Social History Social History   Tobacco Use   Smoking status: Former    Current packs/day: 0.00    Average packs/day: 0.3 packs/day for 10.0 years (2.5 ttl pk-yrs)    Types: Cigarettes    Start date: 04/25/1993    Quit date: 04/26/2003    Years since quitting: 20.3   Smokeless tobacco: Never  Substance Use Topics   Alcohol use: Yes    Alcohol/week: 7.0 standard drinks of alcohol    Types: 7 Glasses of wine per week   Drug use: No     Allergies   Codeine and Bactrim  [sulfamethoxazole -trimethoprim ]   Review of Systems Review of Systems Per HPI  Physical Exam Triage Vital Signs ED Triage Vitals  Encounter Vitals Group     BP 08/11/23 1448 (!) 155/82     Systolic BP Percentile --      Diastolic BP Percentile --      Pulse Rate 08/11/23 1448 (!) 127     Resp 08/11/23 1448 18     Temp 08/11/23 1448 98.5 F (36.9 C)     Temp Source 08/11/23 1448 Oral     SpO2 08/11/23 1448 95 %     Weight --      Height --      Head Circumference --       Peak Flow --      Pain Score 08/11/23 1450 0     Pain Loc --      Pain Education --      Exclude from Growth Chart --    No data found.  Updated Vital Signs BP (!) 155/82 (BP Location: Right Arm)   Pulse (!) 127   Temp 98.5 F (36.9 C) (Oral)   Resp 18   LMP 12/24/2000   SpO2 95%   Visual Acuity Right Eye Distance:   Left Eye Distance:   Bilateral Distance:    Right Eye Near:   Left Eye Near:    Bilateral Near:     Physical Exam Vitals and nursing note reviewed.  Constitutional:      Appearance: Normal appearance. She is not ill-appearing.  HENT:     Head: Atraumatic.  Eyes:     Extraocular Movements: Extraocular movements intact.     Conjunctiva/sclera: Conjunctivae normal.  Cardiovascular:     Rate and Rhythm: Normal rate.  Pulmonary:     Effort: Pulmonary effort is normal.  Abdominal:     General: Bowel sounds are normal. There is no distension.     Palpations: Abdomen is soft.     Tenderness: There is no abdominal tenderness. There is no right CVA tenderness, left CVA tenderness or guarding.  Musculoskeletal:        General: Normal range of motion.     Cervical back: Normal range of motion and neck supple.  Skin:    General: Skin is warm and dry.  Neurological:     Mental Status: She is alert and oriented to person, place, and time.  Psychiatric:        Mood and Affect: Mood normal.        Thought Content: Thought content normal.        Judgment: Judgment normal.      UC Treatments / Results  Labs (all labs ordered are listed, but only abnormal results are displayed) Labs Reviewed  POCT URINALYSIS DIP (MANUAL ENTRY) - Abnormal; Notable for the following components:      Result Value   Clarity, UA cloudy (*)  Ketones, POC UA small (15) (*)    Spec Grav, UA >=1.030 (*)    Protein Ur, POC =100 (*)    Nitrite, UA Positive (*)    Leukocytes, UA Trace (*)    All other components within normal limits  URINE CULTURE     EKG   Radiology No results found.  Procedures Procedures (including critical care time)  Medications Ordered in UC Medications - No data to display  Initial Impression / Assessment and Plan / UC Course  I have reviewed the triage vital signs and the nursing notes.  Pertinent labs & imaging results that were available during my care of the patient were reviewed by me and considered in my medical decision making (see chart for details).     Urinalysis today with evidence of a urinary tract infection.  Urine culture pending, treat with Cipro , discussed fluids, supportive over-the-counter medications and home care.  Return for worsening symptoms.  Final Clinical Impressions(s) / UC Diagnoses   Final diagnoses:  Acute lower UTI   Discharge Instructions   None    ED Prescriptions     Medication Sig Dispense Auth. Provider   ciprofloxacin  (CIPRO ) 500 MG tablet Take 1 tablet (500 mg total) by mouth 2 (two) times daily. 10 tablet Corbin Dess, [place]      PDMP not reviewed this encounter.   Corbin Dess, [place] 08/11/23 1835

## 2023-08-14 ENCOUNTER — Telehealth (HOSPITAL_COMMUNITY): Payer: Self-pay

## 2023-08-14 LAB — URINE CULTURE: Culture: >=100000 — AB

## 2023-08-14 MED ORDER — NITROFURANTOIN MONOHYD MACRO 100 MG PO CAPS
100.0000 mg | ORAL_CAPSULE | Freq: Two times a day (BID) | ORAL | 0 refills | Status: AC
Start: 2023-08-14 — End: ?

## 2023-08-14 NOTE — Telephone Encounter (Signed)
 Per protocol, pt to dc Cipro  and begin treatment with Macrobid . Reviewed with patient, verified pharmacy, prescription sent.
